# Patient Record
Sex: Female | Born: 1945 | Race: White | Hispanic: No | Marital: Married | Smoking: Former smoker
Health system: Southern US, Community
[De-identification: ages and names within clinical notes are randomized; demographics above are authoritative.]

## PROBLEM LIST (undated history)

## (undated) DIAGNOSIS — E079 Disorder of thyroid, unspecified: Secondary | ICD-10-CM

## (undated) DIAGNOSIS — G4710 Hypersomnia, unspecified: Secondary | ICD-10-CM

## (undated) DIAGNOSIS — E7800 Pure hypercholesterolemia, unspecified: Secondary | ICD-10-CM

## (undated) DIAGNOSIS — R351 Nocturia: Secondary | ICD-10-CM

## (undated) DIAGNOSIS — R0683 Snoring: Secondary | ICD-10-CM

## (undated) DIAGNOSIS — H9190 Unspecified hearing loss, unspecified ear: Secondary | ICD-10-CM

## (undated) HISTORY — PX: COLONOSCOPY: SHX174

## (undated) HISTORY — DX: Hypersomnia, unspecified: G47.10

## (undated) HISTORY — DX: Snoring: R06.83

## (undated) HISTORY — DX: Pure hypercholesterolemia, unspecified: E78.00

## (undated) HISTORY — DX: Nocturia: R35.1

## (undated) HISTORY — DX: Unspecified hearing loss, unspecified ear: H91.90

## (undated) HISTORY — DX: Disorder of thyroid, unspecified: E07.9

---

## 2006-07-12 LAB — HM COLONOSCOPY

## 2011-07-13 LAB — HM PAP SMEAR: HM Pap smear: NORMAL

## 2014-07-12 HISTORY — PX: CATARACT EXTRACTION, BILATERAL: SHX1313

## 2015-11-11 DIAGNOSIS — Z1231 Encounter for screening mammogram for malignant neoplasm of breast: Secondary | ICD-10-CM | POA: Diagnosis not present

## 2015-11-11 LAB — HM MAMMOGRAPHY

## 2015-11-19 DIAGNOSIS — H26493 Other secondary cataract, bilateral: Secondary | ICD-10-CM | POA: Diagnosis not present

## 2016-03-08 DIAGNOSIS — M159 Polyosteoarthritis, unspecified: Secondary | ICD-10-CM | POA: Diagnosis not present

## 2016-03-08 DIAGNOSIS — E039 Hypothyroidism, unspecified: Secondary | ICD-10-CM | POA: Diagnosis not present

## 2016-03-08 DIAGNOSIS — E785 Hyperlipidemia, unspecified: Secondary | ICD-10-CM | POA: Diagnosis not present

## 2016-03-08 DIAGNOSIS — R351 Nocturia: Secondary | ICD-10-CM | POA: Diagnosis not present

## 2016-03-28 DIAGNOSIS — J209 Acute bronchitis, unspecified: Secondary | ICD-10-CM | POA: Diagnosis not present

## 2016-04-13 DIAGNOSIS — G4710 Hypersomnia, unspecified: Secondary | ICD-10-CM | POA: Diagnosis not present

## 2016-04-13 DIAGNOSIS — R0683 Snoring: Secondary | ICD-10-CM | POA: Diagnosis not present

## 2016-04-19 DIAGNOSIS — M25562 Pain in left knee: Secondary | ICD-10-CM | POA: Diagnosis not present

## 2016-05-02 DIAGNOSIS — Z23 Encounter for immunization: Secondary | ICD-10-CM | POA: Diagnosis not present

## 2016-05-06 DIAGNOSIS — G4710 Hypersomnia, unspecified: Secondary | ICD-10-CM | POA: Diagnosis not present

## 2016-05-06 DIAGNOSIS — Z780 Asymptomatic menopausal state: Secondary | ICD-10-CM | POA: Diagnosis not present

## 2016-05-06 LAB — HM DEXA SCAN

## 2016-05-26 DIAGNOSIS — G4733 Obstructive sleep apnea (adult) (pediatric): Secondary | ICD-10-CM | POA: Diagnosis not present

## 2016-06-08 DIAGNOSIS — R0683 Snoring: Secondary | ICD-10-CM | POA: Diagnosis not present

## 2016-06-08 DIAGNOSIS — G4710 Hypersomnia, unspecified: Secondary | ICD-10-CM | POA: Diagnosis not present

## 2016-06-08 DIAGNOSIS — G4733 Obstructive sleep apnea (adult) (pediatric): Secondary | ICD-10-CM | POA: Diagnosis not present

## 2016-07-22 DIAGNOSIS — G4733 Obstructive sleep apnea (adult) (pediatric): Secondary | ICD-10-CM | POA: Diagnosis not present

## 2016-08-21 DIAGNOSIS — J111 Influenza due to unidentified influenza virus with other respiratory manifestations: Secondary | ICD-10-CM | POA: Diagnosis not present

## 2016-09-14 DIAGNOSIS — H9190 Unspecified hearing loss, unspecified ear: Secondary | ICD-10-CM | POA: Diagnosis not present

## 2016-09-14 DIAGNOSIS — E785 Hyperlipidemia, unspecified: Secondary | ICD-10-CM | POA: Diagnosis not present

## 2016-09-14 DIAGNOSIS — E039 Hypothyroidism, unspecified: Secondary | ICD-10-CM | POA: Diagnosis not present

## 2016-09-14 DIAGNOSIS — M159 Polyosteoarthritis, unspecified: Secondary | ICD-10-CM | POA: Diagnosis not present

## 2016-09-14 LAB — HEPATIC FUNCTION PANEL
ALT: 20 (ref 7–35)
AST: 21 (ref 13–35)
Alkaline Phosphatase: 65 (ref 25–125)
Bilirubin, Total: 0.5

## 2016-09-14 LAB — BASIC METABOLIC PANEL
BUN: 11 (ref 4–21)
Creatinine: 0.8 (ref 0.5–1.1)
Glucose: 91
Potassium: 4.3 (ref 3.4–5.3)
Sodium: 138 (ref 137–147)

## 2016-09-14 LAB — TSH: TSH: 1.86 (ref 0.41–5.90)

## 2016-09-14 LAB — LIPID PANEL
Cholesterol: 179 (ref 0–200)
HDL: 53 (ref 35–70)
LDL Cholesterol: 109
Triglycerides: 87 (ref 40–160)

## 2017-01-19 DIAGNOSIS — H9202 Otalgia, left ear: Secondary | ICD-10-CM | POA: Diagnosis not present

## 2017-01-24 DIAGNOSIS — Z7289 Other problems related to lifestyle: Secondary | ICD-10-CM | POA: Diagnosis not present

## 2017-01-24 DIAGNOSIS — H7202 Central perforation of tympanic membrane, left ear: Secondary | ICD-10-CM | POA: Diagnosis not present

## 2017-01-24 DIAGNOSIS — H6122 Impacted cerumen, left ear: Secondary | ICD-10-CM | POA: Diagnosis not present

## 2017-01-24 DIAGNOSIS — H9113 Presbycusis, bilateral: Secondary | ICD-10-CM | POA: Diagnosis not present

## 2017-01-24 DIAGNOSIS — Z974 Presence of external hearing-aid: Secondary | ICD-10-CM | POA: Diagnosis not present

## 2017-02-22 DIAGNOSIS — H7292 Unspecified perforation of tympanic membrane, left ear: Secondary | ICD-10-CM | POA: Diagnosis not present

## 2017-03-30 ENCOUNTER — Ambulatory Visit (INDEPENDENT_AMBULATORY_CARE_PROVIDER_SITE_OTHER): Payer: Medicare Other | Admitting: Nurse Practitioner

## 2017-03-30 ENCOUNTER — Encounter: Payer: Self-pay | Admitting: Nurse Practitioner

## 2017-03-30 VITALS — BP 132/80 | HR 95 | Temp 98.0°F | Resp 12 | Ht 63.0 in | Wt 192.8 lb

## 2017-03-30 DIAGNOSIS — E782 Mixed hyperlipidemia: Secondary | ICD-10-CM | POA: Insufficient documentation

## 2017-03-30 DIAGNOSIS — G4733 Obstructive sleep apnea (adult) (pediatric): Secondary | ICD-10-CM

## 2017-03-30 DIAGNOSIS — Z23 Encounter for immunization: Secondary | ICD-10-CM | POA: Diagnosis not present

## 2017-03-30 DIAGNOSIS — M1712 Unilateral primary osteoarthritis, left knee: Secondary | ICD-10-CM | POA: Insufficient documentation

## 2017-03-30 DIAGNOSIS — E034 Atrophy of thyroid (acquired): Secondary | ICD-10-CM

## 2017-03-30 DIAGNOSIS — Z9989 Dependence on other enabling machines and devices: Secondary | ICD-10-CM

## 2017-03-30 LAB — CBC WITH DIFFERENTIAL/PLATELET
Basophils Absolute: 50 cells/uL (ref 0–200)
Basophils Relative: 0.8 %
Eosinophils Absolute: 149 cells/uL (ref 15–500)
Eosinophils Relative: 2.4 %
HCT: 40.6 % (ref 35.0–45.0)
Hemoglobin: 13.7 g/dL (ref 11.7–15.5)
Lymphs Abs: 905 cells/uL (ref 850–3900)
MCH: 31.4 pg (ref 27.0–33.0)
MCHC: 33.7 g/dL (ref 32.0–36.0)
MCV: 92.9 fL (ref 80.0–100.0)
MPV: 11.3 fL (ref 7.5–12.5)
Monocytes Relative: 8.8 %
Neutro Abs: 4551 cells/uL (ref 1500–7800)
Neutrophils Relative %: 73.4 %
Platelets: 281 10*3/uL (ref 140–400)
RBC: 4.37 10*6/uL (ref 3.80–5.10)
RDW: 12.6 % (ref 11.0–15.0)
Total Lymphocyte: 14.6 %
WBC mixed population: 546 cells/uL (ref 200–950)
WBC: 6.2 10*3/uL (ref 3.8–10.8)

## 2017-03-30 LAB — COMPLETE METABOLIC PANEL WITH GFR
AG Ratio: 1.6 (calc) (ref 1.0–2.5)
ALT: 20 U/L (ref 6–29)
AST: 23 U/L (ref 10–35)
Albumin: 4.1 g/dL (ref 3.6–5.1)
Alkaline phosphatase (APISO): 61 U/L (ref 33–130)
BUN: 19 mg/dL (ref 7–25)
CO2: 28 mmol/L (ref 20–32)
Calcium: 9.2 mg/dL (ref 8.6–10.4)
Chloride: 105 mmol/L (ref 98–110)
Creat: 0.88 mg/dL (ref 0.60–0.93)
GFR, Est African American: 76 mL/min/{1.73_m2} (ref >=60)
GFR, Est Non African American: 66 mL/min/{1.73_m2} (ref >=60)
Globulin: 2.6 g/dL (calc) (ref 1.9–3.7)
Glucose, Bld: 103 mg/dL (ref 65–139)
Potassium: 4.4 mmol/L (ref 3.5–5.3)
Sodium: 141 mmol/L (ref 135–146)
Total Bilirubin: 0.4 mg/dL (ref 0.2–1.2)
Total Protein: 6.7 g/dL (ref 6.1–8.1)

## 2017-03-30 LAB — TSH: TSH: 2.22 mIU/L (ref 0.40–4.50)

## 2017-03-30 MED ORDER — LEVOTHYROXINE SODIUM 75 MCG PO TABS: 75.0000 ug | ORAL_TABLET | Freq: Every day | ORAL | 1 refills | Status: DC

## 2017-03-30 MED ORDER — ATORVASTATIN CALCIUM 20 MG PO TABS: 20.0000 mg | ORAL_TABLET | Freq: Every day | ORAL | 1 refills | Status: DC

## 2017-03-30 NOTE — Progress Notes (Signed)
Careteam: Patient Care Team: Lauree Chandler, NP as PCP - General (Geriatric Medicine) Izora Gala, MD as Consulting Physician (Otolaryngology)  Advanced Directive information    No Known Allergies  Chief Complaint  Patient presents with  . Establish Care    New Patient [facility] care, moved to [place] in July from [place] .   . Medication Refill    Refill medications for [facility] day supply- [facility]  . Referral    Need referral to eye doctor and sleep specialist   . Immunizations    Flu Vaccine today      HPI: Patient is a 71 y.o. female seen in the office today for routine follow up and to establish care. Pt relocated to [place] from the [place] because her family lives here (daughter and grandchildren). Still works on [employer], relief. Worked as a Marine scientist now in Engineer, mining  OSA- Would like referral to sleep specialist- using CPAP and needs someone to keep up with this.   Seasonal allergies- using azelastine spray as needed   Vit D Def- taking vit D supplement.   Hypothyroid- taking synthroid 75 mcg daily  Left knee OA- using naproxen at bedtime and as needed   Had gained weight but now getting back on track. Using my fitness pal for calorie tracking and [facility]  Review of Systems:  Review of Systems  Constitutional: Negative for chills, fever and weight loss.  HENT: Positive for hearing loss. Negative for tinnitus.   Respiratory: Negative for cough, sputum production and shortness of breath.   Cardiovascular: Negative for chest pain, palpitations and leg swelling.  Gastrointestinal: Negative for abdominal pain, constipation, diarrhea and heartburn.  Genitourinary: Positive for frequency (chronic). Negative for dysuria and urgency.  Musculoskeletal: Positive for joint pain (left knee). Negative for back pain, falls and myalgias.  Skin: Negative.   Neurological: Negative for dizziness and headaches.  Psychiatric/Behavioral: Negative for depression  and memory loss. The patient does not have insomnia.     Past Medical History:  Diagnosis Date  . Hearing loss   . High cholesterol   . Hypersomnia    Per Records from [facility]   . Nocturia    Per Records from [facility]   . Snoring    Per Records from [facility]   . Thyroid disease    Past Surgical History:  Procedure Laterality Date  . CATARACT EXTRACTION, BILATERAL  07/12/2014  . CESAREAN SECTION     AGE 57  . COLONOSCOPY     10 YEARS AGO AS OF 2018    Social History:   reports that she has quit smoking. Her smoking use included Cigarettes. She has a 3.00 pack-year smoking history. She has never used smokeless tobacco. She reports that she drinks alcohol. She reports that she does not use drugs.  Family History  Problem Relation Age of Onset  . Osteoporosis Mother   . Heart disease Father     Medications: Patient's Medications  New Prescriptions   No medications on file  Previous Medications   ATORVASTATIN (LIPITOR) 20 MG TABLET    Take 20 mg by mouth daily.   AZELASTINE HCL 0.15 % SOLN    Place 1 spray into the nose as needed.   CALCIUM CARBONATE (TUMS - DOSED IN MG ELEMENTAL CALCIUM) 500 MG CHEWABLE TABLET    Chew 1 tablet by mouth daily.   CHOLECALCIFEROL (VITAMIN D) 1000 UNITS TABLET    Take 1,000  Units by mouth daily. D3   LEVOTHYROXINE (SYNTHROID, LEVOTHROID) 75 MCG TABLET    Take 75 mcg by mouth daily before breakfast.   NAPROXEN SODIUM (ANAPROX) 220 MG TABLET    Take 220 mg by mouth as needed.  Modified Medications   No medications on file  Discontinued Medications   NAPROXEN (EC NAPROSYN) 500 MG EC TABLET    Take 500 mg by mouth daily.   OMEPRAZOLE (PRILOSEC OTC) 20 MG TABLET    Take 20 mg by mouth daily as needed.     Physical Exam:  Vitals:   03/30/17 0903  BP: 132/80  Pulse: 95  Resp: 12  Temp: 98 F (36.7 C)  TempSrc: Oral  SpO2: 97%  Weight: 192 lb 12.8 oz (87.5 kg)  Height: 5' 3"  (1.6 m)   Body mass  index is 34.15 kg/m.  Physical Exam  Constitutional: She is oriented to person, place, and time. She appears well-developed and well-nourished. No distress.  HENT:  Head: Normocephalic and atraumatic.  Mouth/Throat: Oropharynx is clear and moist.  Eyes: Pupils are equal, round, and reactive to light. Conjunctivae are normal.  Neck: Normal range of motion.  Cardiovascular: Normal rate, regular rhythm and normal heart sounds.   Pulmonary/Chest: Effort normal and breath sounds normal.  Abdominal: Soft. Bowel sounds are normal.  Musculoskeletal: She exhibits no edema or tenderness.  Neurological: She is alert and oriented to person, place, and time.  Skin: Skin is warm and dry. She is not diaphoretic.  Psychiatric: She has a normal mood and affect.    Labs reviewed: Basic Metabolic Panel:  Recent Labs  09/14/16  NA 138  K 4.3  BUN 11  CREATININE 0.8  TSH 1.86   Liver Function Tests:  Recent Labs  09/14/16  AST 21  ALT 20  ALKPHOS 65   No results for input(s): LIPASE, AMYLASE in the last 8760 hours. No results for input(s): AMMONIA in the last 8760 hours. CBC: No results for input(s): WBC, NEUTROABS, HGB, HCT, MCV, PLT in the last 8760 hours. Lipid Panel:  Recent Labs  09/14/16  CHOL 179  HDL 53  LDLCALC 109  TRIG 87   TSH:  Recent Labs  09/14/16  TSH 1.86   A1C: No results found for: HGBA1C   Assessment/Plan .1. OSA on CPAP Uses CPAP nightly, needs to establish with pulmonary for supplies  - Ambulatory referral to Pulmonology  2. Mixed hyperlipidemia Has been making dietary changes and watching calories as well as increase in activity.  - atorvastatin (LIPITOR) 20 MG tablet; Take 1 tablet (20 mg total) by mouth daily.  Dispense: 90 tablet; Refill: 1 - CMP with eGFR  3. Hypothyroidism due to acquired atrophy of thyroid - levothyroxine (SYNTHROID, LEVOTHROID) 75 MCG tablet; Take 1 tablet (75 mcg total) by mouth daily before breakfast.  Dispense: 90  tablet; Refill: 1 - TSH  4. Osteoarthritis of left knee, unspecified osteoarthritis type -controlled with the uses aleve qhs and occasionally during the day.  - CBC with Differential/Platelets  5.  High-dose Flu shot given.   Next appt: 6 weeks for AWV and extended visit.  Carlos American. Harle Battiest  [facility] [phone] 8 am - 5 pm) [phone] (after hours)

## 2017-03-30 NOTE — Patient Instructions (Addendum)
[facility]  Address: [address] [place], [place], [place] [address] Phone: [phone]   [facility] Address: [address] [place], [place], [place] [address] Phone: [phone]   Follow up in 6 weeks for AWV and Physical

## 2017-04-01 DIAGNOSIS — H7292 Unspecified perforation of tympanic membrane, left ear: Secondary | ICD-10-CM | POA: Diagnosis not present

## 2017-04-01 DIAGNOSIS — H9113 Presbycusis, bilateral: Secondary | ICD-10-CM | POA: Diagnosis not present

## 2017-04-01 DIAGNOSIS — H60332 Swimmer's ear, left ear: Secondary | ICD-10-CM | POA: Diagnosis not present

## 2017-05-05 ENCOUNTER — Ambulatory Visit (INDEPENDENT_AMBULATORY_CARE_PROVIDER_SITE_OTHER): Payer: Medicare Other

## 2017-05-05 VITALS — BP 136/72 | HR 95 | Temp 98.0°F | Ht 63.0 in | Wt 192.0 lb

## 2017-05-05 DIAGNOSIS — Z0000 Encounter for general adult medical examination without abnormal findings: Secondary | ICD-10-CM | POA: Diagnosis not present

## 2017-05-05 MED ORDER — ZOSTER VAC RECOMB ADJUVANTED 50 MCG/0.5ML IM SUSR: 0.5000 mL | Freq: Once | INTRAMUSCULAR | 1 refills | Status: AC

## 2017-05-05 NOTE — Patient Instructions (Signed)
[name] , Thank you for taking time to come for your Medicare Wellness Visit. I appreciate your ongoing commitment to your health goals. Please review the following plan we discussed and let me know if I can assist you in the future.   Screening recommendations/referrals: Colonoscopy due Mammogram up to date. Due 11/10/2017 Bone Density up to date Recommended yearly ophthalmology/optometry visit for glaucoma screening and checkup Recommended yearly dental visit for hygiene and checkup  Vaccinations: Influenza vaccine up to date. Due 2019 fall season Pneumococcal vaccine  Tdap vaccine up to date. Due 07/13/2019 Shingles vaccine due, if you decide you want this we will sent the prescription to your pharmacy  Advanced directives: Please bring us a copy of your living will and health care power of attorney  Conditions/risks identified: None  Next appointment: Ann ChattersJessica Eubanks NP 05/11/2017 @ 1:30pm   Preventive Care 65 Years and Older, Female Preventive care refers to lifestyle choices and visits with your health care provider that can promote health and wellness. What does preventive care include?  A yearly physical exam. This is also called an annual well check.  Dental exams once or twice a year.  Routine eye exams. Ask your health care provider how often you should have your eyes checked.  Personal lifestyle choices, including:  Daily care of your teeth and gums.  Regular physical activity.  Eating a healthy diet.  Avoiding tobacco and drug use.  Limiting alcohol use.  Practicing safe sex.  Taking low-dose aspirin every day.  Taking vitamin and mineral supplements as recommended by your health care provider. What happens during an annual well check? The services and screenings done by your health care provider during your annual well check will depend on your age, overall health, lifestyle risk factors, and family history of disease. Counseling  Your health care  provider may ask you questions about your:  Alcohol use.  Tobacco use.  Drug use.  Emotional well-being.  Home and relationship well-being.  Sexual activity.  Eating habits.  History of falls.  Memory and ability to understand (cognition).  Work and work Astronomerenvironment.  Reproductive health. Screening  You may have the following tests or measurements:  Height, weight, and BMI.  Blood pressure.  Lipid and cholesterol levels. These may be checked every 5 years, or more frequently if you are over 72 years old.  Skin check.  Lung cancer screening. You may have this screening every year starting at age 72 if you have a 30-pack-year history of smoking and currently smoke or have quit within the past 15 years.  Fecal occult blood test (FOBT) of the stool. You may have this test every year starting at age 72.  Flexible sigmoidoscopy or colonoscopy. You may have a sigmoidoscopy every 5 years or a colonoscopy every 10 years starting at age 72.  Hepatitis C blood test.  Hepatitis B blood test.  Sexually transmitted disease (STD) testing.  Diabetes screening. This is done by checking your blood sugar (glucose) after you have not eaten for a while (fasting). You may have this done every 1-3 years.  Bone density scan. This is done to screen for osteoporosis. You may have this done starting at age 72.  Mammogram. This may be done every 1-2 years. Talk to your health care provider about how often you should have regular mammograms. Talk with your health care provider about your test results, treatment options, and if necessary, the need for more tests. Vaccines  Your health care provider may recommend  certain vaccines, such as:  Influenza vaccine. This is recommended every year.  Tetanus, diphtheria, and acellular pertussis (Tdap, Td) vaccine. You may need a Td booster every 10 years.  Zoster vaccine. You may need this after age 72.  Pneumococcal 13-valent conjugate (PCV13)  vaccine. One dose is recommended after age 72.  Pneumococcal polysaccharide (PPSV23) vaccine. One dose is recommended after age 72. Talk to your health care provider about which screenings and vaccines you need and how often you need them. This information is not intended to replace advice given to you by your health care provider. Make sure you discuss any questions you have with your health care provider. Document Released: 07/25/2015 Document Revised: 03/17/2016 Document Reviewed: 04/29/2015 Elsevier Interactive Patient Education  2017 [place] Prevention in the Home Falls can cause injuries. They can happen to people of all ages. There are many things you can do to make your home safe and to help prevent falls. What can I do on the outside of my home?  Regularly fix the edges of walkways and driveways and fix any cracks.  Remove anything that might make you trip as you walk through a door, such as a raised step or threshold.  Trim any bushes or trees on the path to your home.  Use bright outdoor lighting.  Clear any walking paths of anything that might make someone trip, such as rocks or tools.  Regularly check to see if handrails are loose or broken. Make sure that both sides of any steps have handrails.  Any raised decks and porches should have guardrails on the edges.  Have any leaves, snow, or ice cleared regularly.  Use sand or salt on walking paths during winter.  Clean up any spills in your garage right away. This includes oil or grease spills. What can I do in the bathroom?  Use night lights.  Install grab bars by the toilet and in the tub and shower. Do not use towel bars as grab bars.  Use non-skid mats or decals in the tub or shower.  If you need to sit down in the shower, use a plastic, non-slip stool.  Keep the floor dry. Clean up any water that spills on the floor as soon as it happens.  Remove soap buildup in the tub or shower  regularly.  Attach bath mats securely with double-sided non-slip rug tape.  Do not have throw rugs and other things on the floor that can make you trip. What can I do in the bedroom?  Use night lights.  Make sure that you have a light by your bed that is easy to reach.  Do not use any sheets or blankets that are too big for your bed. They should not hang down onto the floor.  Have a firm chair that has side arms. You can use this for support while you get dressed.  Do not have throw rugs and other things on the floor that can make you trip. What can I do in the kitchen?  Clean up any spills right away.  Avoid walking on wet floors.  Keep items that you use a lot in easy-to-reach places.  If you need to reach something above you, use a strong step stool that has a grab bar.  Keep electrical cords out of the way.  Do not use floor polish or wax that makes floors slippery. If you must use wax, use non-skid floor wax.  Do not have throw rugs and other  things on the floor that can make you trip. What can I do with my stairs?  Do not leave any items on the stairs.  Make sure that there are handrails on both sides of the stairs and use them. Fix handrails that are broken or loose. Make sure that handrails are as long as the stairways.  Check any carpeting to make sure that it is firmly attached to the stairs. Fix any carpet that is loose or worn.  Avoid having throw rugs at the top or bottom of the stairs. If you do have throw rugs, attach them to the floor with carpet tape.  Make sure that you have a light switch at the top of the stairs and the bottom of the stairs. If you do not have them, ask someone to add them for you. What else can I do to help prevent falls?  Wear shoes that:  Do not have high heels.  Have rubber bottoms.  Are comfortable and fit you well.  Are closed at the toe. Do not wear sandals.  If you use a stepladder:  Make sure that it is fully  opened. Do not climb a closed stepladder.  Make sure that both sides of the stepladder are locked into place.  Ask someone to hold it for you, if possible.  Clearly mark and make sure that you can see:  Any grab bars or handrails.  First and last steps.  Where the edge of each step is.  Use tools that help you move around (mobility aids) if they are needed. These include:  Canes.  Walkers.  Scooters.  Crutches.  Turn on the lights when you go into a dark area. Replace any light bulbs as soon as they burn out.  Set up your furniture so you have a clear path. Avoid moving your furniture around.  If any of your floors are uneven, fix them.  If there are any pets around you, be aware of where they are.  Review your medicines with your doctor. Some medicines can make you feel dizzy. This can increase your chance of falling. Ask your doctor what other things that you can do to help prevent falls. This information is not intended to replace advice given to you by your health care provider. Make sure you discuss any questions you have with your health care provider. Document Released: 04/24/2009 Document Revised: 12/04/2015 Document Reviewed: 08/02/2014 Elsevier Interactive Patient Education  2017 [employer].

## 2017-05-05 NOTE — Progress Notes (Signed)
Subjective:   Ann Hamilton is a 71 y.o. female who presents for an Initial Medicare Annual Wellness Visit.     Objective:    Today's Vitals   05/05/17 1002  BP: 136/72  Pulse: 95  Temp: 98 F (36.7 C)  TempSrc: Oral  SpO2: 97%  Weight: 192 lb (87.1 kg)  Height: 5\' 3"  (1.6 m)   Body mass index is 34.01 kg/m.   Current Medications (verified) Outpatient Encounter Prescriptions as of 05/05/2017  Medication Sig  . atorvastatin (LIPITOR) 20 MG tablet Take 1 tablet (20 mg total) by mouth daily.  . Azelastine HCl 0.15 % SOLN Place 1 spray into the nose as needed.  . calcium carbonate (TUMS - DOSED IN MG ELEMENTAL CALCIUM) 500 MG chewable tablet Chew 1 tablet by mouth daily.  . cholecalciferol (VITAMIN D) 1000 units tablet Take 1,000 Units by mouth daily. D3  . levothyroxine (SYNTHROID, LEVOTHROID) 75 MCG tablet Take 1 tablet (75 mcg total) by mouth daily before breakfast.  . naproxen sodium (ANAPROX) 220 MG tablet Take 220 mg by mouth as needed.  . Zoster Vaccine Adjuvanted [facility]) injection Inject 0.5 mLs into the muscle once.   No facility-administered encounter medications on file as of 05/05/2017.     Allergies (verified) Patient has no known allergies.   History: Past Medical History:  Diagnosis Date  . Hearing loss   . High cholesterol   . Hypersomnia    Per [facility] from [facility]   . Nocturia    Per Records from [facility]   . Snoring    Per [facility]   . Thyroid disease    Past Surgical History:  Procedure Laterality Date  . CATARACT EXTRACTION, BILATERAL  07/12/2014  . CESAREAN SECTION     AGE 68  . COLONOSCOPY     10 YEARS AGO AS OF 2018    Family History  Problem Relation Age of Onset  . Osteoporosis Mother   . Heart disease Father   . Cancer Father   . Heart failure Father    Social History   Occupational History  . Not on file.   Social History Main Topics  . Smoking status: Former  Smoker    Packs/day: 1.00    Years: 3.00    Types: Cigarettes  . Smokeless tobacco: Never Used     Comment: Quit 50 years ago  . Alcohol use Yes     Comment: 2 drinks weekly   . Drug use: No  . Sexual activity: Not on file    Tobacco Counseling Counseling given: Not Answered   Activities of Daily Living In your present state of health, do you have any difficulty performing the following activities: 05/05/2017  Hearing? N  Vision? N  Difficulty concentrating or making decisions? N  Walking or climbing stairs? N  Dressing or bathing? N  Doing errands, shopping? N  Preparing Food and eating ? N  Using the Toilet? N  In the past six months, have you accidently leaked urine? N  Do you have problems with loss of bowel control? N  Managing your Medications? N  Managing your Finances? N  Housekeeping or managing your Housekeeping? N    Immunizations and Health Maintenance Immunization History  Administered Date(s) Administered  . Influenza, High Dose Seasonal PF 03/30/2017  . Influenza-Unspecified 04/11/2016  . Td 07/12/2009   Health Maintenance Due  Topic Date Due  . COLONOSCOPY  07/12/2016    Patient Care  Team: Sharon SellerEubanks, Jessica K, NP as PCP - General (Geriatric Medicine) Serena Colonelosen, Jefry, MD as Consulting Physician (Otolaryngology)  Indicate any recent Medical Services you may have received from other than [facility] providers in the past year (date may be approximate).     Assessment:   This is a routine wellness examination for Ann Hamilton.   Hearing/Vision screen No exam data present  Dietary issues and exercise activities discussed: Current Exercise Habits: Home exercise routine;Structured exercise class, Type of exercise: Other - see comments ([employer] classes), Time (Minutes): 40, Frequency (Times/Week): 3, Weekly Exercise (Minutes/Week): 120, Intensity: Mild  Goals    . Exercise 3x per week (30 min per time)          Patient will continue exercising 3 days a week.       Depression Screen PHQ 2/9 Scores 03/30/2017  PHQ - 2 Score 0    Fall Risk Fall Risk  05/05/2017 03/30/2017  Falls in the past year? No No    Cognitive Function: MMSE - Mini Mental State Exam 05/05/2017  Orientation to time 5  Orientation to Place 5  Registration 3  Attention/ Calculation 5  Recall 3  Language- name 2 objects 2  Language- repeat 1  Language- follow 3 step command 3  Language- read & follow direction 1  Write a sentence 1  Copy design 1  Total score 30        Screening Tests Health Maintenance  Topic Date Due  . COLONOSCOPY  07/12/2016  . MAMMOGRAM  11/10/2017  . TETANUS/TDAP  07/13/2019  . INFLUENZA VACCINE  Completed  . DEXA SCAN  Completed  . Hepatitis C Screening  Completed  . PNA vac Low Risk Adult  Completed      Plan:  I have personally reviewed and addressed the Medicare Annual Wellness questionnaire and have noted the following in the patient's chart:  A. Medical and social history B. Use of alcohol, tobacco or illicit drugs  C. Current medications and supplements D. Functional ability and status E.  Nutritional status F.  Physical activity G. Advance directives H. List of other physicians I.  Hospitalizations, surgeries, and ER visits in previous 12 months J.  Vitals K. Screenings to include hearing, vision, cognitive, depression L. Referrals and appointments - none  In addition, I have reviewed and discussed with patient certain preventive protocols, quality metrics, and best practice recommendations. A written personalized care plan for preventive services as well as general preventive health recommendations were provided to patient.  See attached scanned questionnaire for additional information.   Signed,   Annetta MawSara Gonthier, RN Nurse Health Advisor   Quick Notes   Health Maintenance: Patient will think about getting a colonoscopy. Shingrix sent to pharmacy.     Abnormal Screen: MMSE 30/30. Passed clock  drawing     Patient Concerns: None     Nurse Concerns: None

## 2017-05-11 ENCOUNTER — Ambulatory Visit (INDEPENDENT_AMBULATORY_CARE_PROVIDER_SITE_OTHER): Payer: Medicare Other | Admitting: Pulmonary Disease

## 2017-05-11 ENCOUNTER — Ambulatory Visit (INDEPENDENT_AMBULATORY_CARE_PROVIDER_SITE_OTHER): Payer: Medicare Other | Admitting: Nurse Practitioner

## 2017-05-11 ENCOUNTER — Encounter: Payer: Self-pay | Admitting: Pulmonary Disease

## 2017-05-11 ENCOUNTER — Encounter: Payer: Self-pay | Admitting: Nurse Practitioner

## 2017-05-11 VITALS — BP 124/82 | HR 104 | Ht 63.0 in | Wt 192.0 lb

## 2017-05-11 VITALS — BP 134/84 | HR 88 | Temp 97.5°F | Resp 18 | Ht 63.0 in | Wt 194.6 lb

## 2017-05-11 DIAGNOSIS — Z0000 Encounter for general adult medical examination without abnormal findings: Secondary | ICD-10-CM

## 2017-05-11 DIAGNOSIS — E034 Atrophy of thyroid (acquired): Secondary | ICD-10-CM | POA: Diagnosis not present

## 2017-05-11 DIAGNOSIS — Z9989 Dependence on other enabling machines and devices: Secondary | ICD-10-CM

## 2017-05-11 DIAGNOSIS — G4733 Obstructive sleep apnea (adult) (pediatric): Secondary | ICD-10-CM

## 2017-05-11 DIAGNOSIS — M1712 Unilateral primary osteoarthritis, left knee: Secondary | ICD-10-CM

## 2017-05-11 DIAGNOSIS — M8580 Other specified disorders of bone density and structure, unspecified site: Secondary | ICD-10-CM

## 2017-05-11 DIAGNOSIS — E782 Mixed hyperlipidemia: Secondary | ICD-10-CM | POA: Diagnosis not present

## 2017-05-11 NOTE — Progress Notes (Signed)
Subjective:    Patient ID: Myrtie CruiseJane Roberg, female    DOB: 02/15/1946, 71 y.o.   MRN: [id number]  HPI  Chief Complaint  Patient presents with  . Sleep Consult    Patient is already on a CPAP machine. Uses [employer] as her DME. Patient brought copies of her SS with her.    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  OSA was diagnosed in 2017 by PSG that showed AHI of 15/hour with lowest desaturation around 81%.  Based on this he was started on CPAP of 11 cm with full facemask with excellent improvement in his daytime somnolence and fatigue.  She has a [employer] machine with a full facemask and her DME is [facility] but only complaint is that FF mask leaves indentation on her bridge of nose  Sleepiness score now has decreased to 1 and she denies Bedtime is between 9 and 10 PM, sleep latency is about 20 minutes, she sleeps on her side with 2 pillows, reports 2 nocturnal awakenings including nocturia that is much improved than prior and is out of bed at 6:30 AM feeling rested without dryness of mouth or headache.  She has gained about 15 pounds in the last 2 years There is no history suggestive of cataplexy, sleep paralysis or parasomnias   CPAP download was reviewed from her machine today which showed excellent control of events on 11 cm with excellent compliance and minimal leak  Past Medical History:  Diagnosis Date  . Hearing loss   . High cholesterol   . Hypersomnia    [facility]   . Nocturia    [facility]   . Snoring    [facility]   . Thyroid disease      Past Surgical History:  Procedure Laterality Date  . CATARACT EXTRACTION, BILATERAL  07/12/2014  . CESAREAN SECTION     AGE 53  . COLONOSCOPY     10 YEARS AGO AS OF 2018     No Known Allergies    Social History    Social History  . Marital status: Married    Spouse name: N/A  . Number of children: N/A  . Years of education: N/A   Occupational History  . Not on file.   Social History Main Topics  . Smoking status: Former Smoker    Packs/day: 1.00    Years: 3.00    Types: Cigarettes  . Smokeless tobacco: Never Used     Comment: Quit 50 years ago  . Alcohol use Yes     Comment: 2 drinks weekly   . Drug use: No  . Sexual activity: Not on file   Other Topics Concern  . Not on file   Social History Narrative   Diet: No      Caffeine: Yes      Married, if yes what year: Yes, 1968      Do you live in a house, apartment, assisted living, condo, trailer, ect: Condo, one stories, 2 persons       Pets: No       Current/Past profession: Charity fundraiserN      Exercise: Yes, 3 times weekly, [employer]         Living Will: Yes   DNR: No   POA/HPOA: No      Questions below  were not included in New Patient Packet      Functional Status:   Do you have difficulty bathing or dressing yourself?   Do you have difficulty preparing food or eating?   Do you have difficulty managing your medications?   Do you have difficulty managing your finances?   Do you have difficulty affording your medications?         Family History  Problem Relation Age of Onset  . Osteoporosis Mother   . Heart disease Father   . Cancer Father   . Heart failure Father      Review of Systems Positive for acid heartburn   Constitutional: negative for anorexia, fevers and sweats  Eyes: negative for irritation, redness and visual disturbance  Ears, nose, mouth, throat, and face: negative for earaches, epistaxis, nasal congestion and sore throat  Respiratory: negative for cough, dyspnea on exertion, sputum and wheezing  Cardiovascular: negative for chest pain, dyspnea, lower extremity edema, orthopnea, palpitations and syncope  Gastrointestinal: negative for abdominal pain, constipation, diarrhea, melena, nausea and  vomiting  Genitourinary:negative for dysuria, frequency and hematuria  Hematologic/lymphatic: negative for bleeding, easy bruising and lymphadenopathy  Musculoskeletal:negative for arthralgias, muscle weakness and stiff joints  Neurological: negative for coordination problems, gait problems, headaches and weakness  Endocrine: negative for diabetic symptoms including polydipsia, polyuria and weight loss     Objective:   Physical Exam  Gen. Pleasant, obese, in no distress, normal affect ENT - no lesions, no post nasal drip, class 2-3 airway Neck: No JVD, no thyromegaly, no carotid bruits Lungs: no use of accessory muscles, no dullness to percussion, decreased without rales or rhonchi  Cardiovascular: Rhythm regular, heart sounds  normal, no murmurs or gallops, no peripheral edema Abdomen: soft and non-tender, no hepatosplenomegaly, BS normal. Musculoskeletal: No deformities, no cyanosis or clubbing Neuro:  alert, non focal, no tremors       Assessment & Plan:

## 2017-05-11 NOTE — Patient Instructions (Signed)
CPAP is set at 11 cm & is working well Trial of nasal pillows

## 2017-05-11 NOTE — Assessment & Plan Note (Signed)
CPAP is set at 11 cm & is working well Trial of nasal pillows since FF mask leaves indentation on her bridge of nose CPAP supplies will be renewed for a year  Weight loss encouraged, compliance with goal of at least 4-6 hrs every night is the expectation. Advised against medications with sedative side effects Cautioned against driving when sleepy - understanding that sleepiness will vary on a day to day basis

## 2017-05-11 NOTE — Progress Notes (Signed)
Provider: Sharon Seller, NP  Patient Care Team: Sharon Seller, NP as PCP - General (Geriatric Medicine) Serena Colonel, MD as Consulting Physician (Otolaryngology)  Extended Emergency Contact Information Primary Emergency Contact: Ciavarella,William Address: [address] [place], [place] [address] [place] Home Phone: [phone] Mobile Phone: [phone] Relation: Spouse No Known Allergies Code Status: FULL Goals of Care: Advanced Directive information Advanced Directives 05/11/2017  Does Patient Have a Medical Advance Directive? No  Type of Advance Directive -  Does patient want to make changes to medical advance directive? -  Copy of Healthcare Power of Attorney in Chart? -     Chief Complaint  Patient presents with  . Medical Management of Chronic Issues    extended visit     HPI: Patient is a 71 y.o. female seen in today for an annual wellness exam.   Major illnesses or hospitalization in the last year- none Has spoken with daughter and husband about living will and healthcare wishes   Depression screen [facility] 2/9 03/30/2017  Decreased Interest 0  Down, Depressed, Hopeless 0  PHQ - 2 Score 0    Fall Risk  05/11/2017 05/05/2017 03/30/2017  Falls in the past year? No No No   MMSE - Mini Mental State Exam 05/05/2017  Orientation to time 5  Orientation to Place 5  Registration 3  Attention/ Calculation 5  Recall 3  Language- name 2 objects 2  Language- repeat 1  Language- follow 3 step command 3  Language- read & follow direction 1  Write a sentence 1  Copy design 1  Total score 30     Health Maintenance  Topic Date Due  . COLONOSCOPY  07/12/2016  . MAMMOGRAM  11/10/2017  . TETANUS/TDAP  07/13/2019  . INFLUENZA VACCINE  Completed  . DEXA SCAN  Completed  . Hepatitis C Screening  Completed  . PNA vac Low Risk Adult  Completed    Urinary incontinence? none Diet?aware she needs to make diet changes, eats out a  lot.  Exercise? Going to [employer]- active aging program 3 times a week- 55 mins. Thi chi - 30 mins.  Dentition: need a dentist, generally she goes every 6 months Pain: none  Past Medical History:  Diagnosis Date  . Hearing loss   . High cholesterol   . Hypersomnia    Per [facility]   . Nocturia    Per [facility]   . Snoring    Per [facility]   . Thyroid disease     Past Surgical History:  Procedure Laterality Date  . CATARACT EXTRACTION, BILATERAL  07/12/2014  . CESAREAN SECTION     AGE 77  . COLONOSCOPY     10 YEARS AGO AS OF 2018     Social History   Social History  . Marital status: Married    Spouse name: N/A  . Number of children: N/A  . Years of education: N/A   Social History Main Topics  . Smoking status: Former Smoker    Packs/day: 1.00    Years: 3.00    Types: Cigarettes  . Smokeless tobacco: Never Used     Comment: Quit 50 years ago  . Alcohol use Yes     Comment: 2 drinks weekly   . Drug use: No  . Sexual activity: Not Asked   Other Topics Concern  . None  Social History Narrative   Diet: No      Caffeine: Yes      Married, if yes what year: Yes, 1968      Do you live in a house, apartment, assisted living, condo, trailer, ect: Condo, one stories, 2 persons       Pets: No       Current/Past profession: Charity fundraiserN      Exercise: Yes, 3 times weekly, [employer]         Living Will: Yes   DNR: No   POA/HPOA: No      Questions below were not included in New Patient Packet      Functional Status:   Do you have difficulty bathing or dressing yourself?   Do you have difficulty preparing food or eating?   Do you have difficulty managing your medications?   Do you have difficulty managing your finances?   Do you have difficulty affording your medications?       Family History  Problem Relation Age of Onset  . Osteoporosis Mother   . Heart disease Father   . Cancer Father   .  Heart failure Father     Review of Systems:  Review of Systems  Constitutional: Negative for activity change, appetite change, fatigue and unexpected weight change.  HENT: Positive for hearing loss. Negative for congestion.        Occasionally will get sinus issues, worse in the winter  Eyes: Negative.   Respiratory: Negative for cough and shortness of breath.   Cardiovascular: Negative for chest pain, palpitations and leg swelling.  Gastrointestinal: Negative for abdominal pain, constipation and diarrhea.  Genitourinary: Negative for difficulty urinating and dysuria.  Musculoskeletal: Negative for arthralgias and myalgias.       Occasional will get left lower back pain requiring muscle relaxer due to back spasms, no problems recently  Skin: Negative for color change and wound.       Tiny spot on left lower leg that dermatologist diagnosised as psoriasis, uses steroid cream when it gets bad   Neurological: Negative for dizziness and weakness.  Psychiatric/Behavioral: Negative for agitation, behavioral problems and confusion.     Allergies as of 05/11/2017   No Known Allergies     Medication List       Accurate as of 05/11/17 11:59 PM. Always use your most recent med list.          atorvastatin 20 MG tablet Commonly known as:  LIPITOR Take 1 tablet (20 mg total) by mouth daily.   Azelastine HCl 0.15 % Soln Place 1 spray into the nose as needed.   calcium carbonate 500 MG chewable tablet Commonly known as:  TUMS - dosed in mg elemental calcium Chew 1 tablet by mouth daily.   cholecalciferol 1000 units tablet Commonly known as:  VITAMIN D Take 1,000 Units by mouth daily. D3   levothyroxine 75 MCG tablet Commonly known as:  SYNTHROID, LEVOTHROID Take 1 tablet (75 mcg total) by mouth daily before breakfast.   naproxen sodium 220 MG tablet Commonly known as:  ALEVE Take 220 mg by mouth as needed.         Physical Exam: Vitals:   05/11/17 1349  BP: 134/84    Pulse: 88  Resp: 18  Temp: (!) 97.5 F (36.4 C)  TempSrc: Oral  SpO2: 97%  Weight: 194 lb 9.6 oz (88.3 kg)  Height: 5\' 3"  (1.6 m)   Body mass index is 34.47 kg/m. Physical Exam  Constitutional: She  is oriented to person, place, and time. She appears well-developed and well-nourished. No distress.  HENT:  Head: Normocephalic and atraumatic.  Mouth/Throat: Oropharynx is clear and moist. No oropharyngeal exudate.  Eyes: Pupils are equal, round, and reactive to light. Conjunctivae are normal.  Neck: Normal range of motion. Neck supple.  Cardiovascular: Normal rate, regular rhythm and normal heart sounds.   Pulmonary/Chest: Effort normal and breath sounds normal.  Abdominal: Soft. Bowel sounds are normal.  Musculoskeletal: She exhibits no edema or tenderness.  Neurological: She is alert and oriented to person, place, and time.  Skin: Skin is warm and dry. She is not diaphoretic.  Psychiatric: She has a normal mood and affect.    Labs reviewed: Basic Metabolic Panel:  Recent Labs  16/10/96 03/30/17 1019  NA 138 141  K 4.3 4.4  CL  --  105  CO2  --  28  GLUCOSE  --  103  BUN 11 19  CREATININE 0.8 0.88  CALCIUM  --  9.2  TSH 1.86 2.22   Liver Function Tests:  Recent Labs  09/14/16 03/30/17 1019  AST 21 23  ALT 20 20  ALKPHOS 65  --   BILITOT  --  0.4  PROT  --  6.7   No results for input(s): LIPASE, AMYLASE in the last 8760 hours. No results for input(s): AMMONIA in the last 8760 hours. CBC:  Recent Labs  03/30/17 1019  WBC 6.2  NEUTROABS 4,551  HGB 13.7  HCT 40.6  MCV 92.9  PLT 281   Lipid Panel:  Recent Labs  09/14/16  CHOL 179  HDL 53  LDLCALC 109  TRIG 87   No results found for: HGBA1C  Procedures: No results found.  Assessment/Plan 1. OSA on CPAP -has meet with pulmonary, conts on CPAP  2. Mixed hyperlipidemia -conts on atorvastatin. Diet modifications and exercise. LDL at goal.   3. Hypothyroidism due to acquired atrophy of  thyroid TSH in normal range, conts on synthroid 75 mcg daily  4. Osteoarthritis of left knee, unspecified osteoarthritis type Occasional joint pain, uses an aleve rarely which is very beneficial.   5. Osteopenia Dexa scan 10/26/217 showing OP. Cont on calcium with Vit D with weight bearing activity   6. Wellness examination -doing well.The patient was counseled regarding the appropriate use of alcohol, regular self-examination of the breasts on a monthly basis, prevention of dental and periodontal disease, diet, regular sustained exercise for at least 30 minutes 5 times per week, routine screening interval for mammogram as recommended by the American Cancer Society and ACOG, tobacco use,  and recommended schedule for GI hemoccult testing, colonoscopy, cholesterol, thyroid and diabetes screening. -plans to start a weight watchers program for accountability  -agreeable to cologuard   Next appt: 6 months with Dr Raynelle Dick K. Biagio Borg  [facility] [phone] 8 am - 5 pm) [phone] (after hours)

## 2017-05-11 NOTE — Patient Instructions (Signed)
-weight watchers is a good program to help with accountability    DASH Eating Plan DASH stands for "Dietary Approaches to Stop Hypertension." The DASH eating plan is a healthy eating plan that has been shown to reduce high blood pressure (hypertension). It may also reduce your risk for type 2 diabetes, heart disease, and stroke. The DASH eating plan may also help with weight loss. What are tips for following this plan? General guidelines  Avoid eating more than 2,300 mg (milligrams) of salt (sodium) a day. If you have hypertension, you may need to reduce your sodium intake to 1,500 mg a day.  Limit alcohol intake to no more than 1 drink a day for nonpregnant women and 2 drinks a day for men. One drink equals 12 oz of beer, 5 oz of wine, or 1 oz of hard liquor.  Work with your health care provider to maintain a healthy body weight or to lose weight. Ask what an ideal weight is for you.  Get at least 30 minutes of exercise that causes your heart to beat faster (aerobic exercise) most days of the week. Activities may include walking, swimming, or biking.  Work with your health care provider or diet and nutrition specialist (dietitian) to adjust your eating plan to your individual calorie needs. Reading food labels  Check food labels for the amount of sodium per serving. Choose foods with less than 5 percent of the Daily Value of sodium. Generally, foods with less than 300 mg of sodium per serving fit into this eating plan.  To find whole grains, look for the word "whole" as the first word in the ingredient list. Shopping  Buy products labeled as "low-sodium" or "no salt added."  Buy fresh foods. Avoid canned foods and premade or frozen meals. Cooking  Avoid adding salt when cooking. Use salt-free seasonings or herbs instead of table salt or sea salt. Check with your health care provider or pharmacist before using salt substitutes.  Do not fry foods. Cook foods using healthy methods such  as baking, boiling, grilling, and broiling instead.  Cook with heart-healthy oils, such as olive, canola, soybean, or sunflower oil. Meal planning   Eat a balanced diet that includes: ? 5 or more servings of fruits and vegetables each day. At each meal, try to fill half of your plate with fruits and vegetables. ? Up to 6-8 servings of whole grains each day. ? Less than 6 oz of lean meat, poultry, or fish each day. A 3-oz serving of meat is about the same size as a deck of cards. One egg equals 1 oz. ? 2 servings of low-fat dairy each day. ? A serving of nuts, seeds, or beans 5 times each week. ? Heart-healthy fats. Healthy fats called Omega-3 fatty acids are found in foods such as flaxseeds and coldwater fish, like sardines, salmon, and mackerel.  Limit how much you eat of the following: ? Canned or prepackaged foods. ? Food that is high in trans fat, such as fried foods. ? Food that is high in saturated fat, such as fatty meat. ? Sweets, desserts, sugary drinks, and other foods with added sugar. ? Full-fat dairy products.  Do not salt foods before eating.  Try to eat at least 2 vegetarian meals each week.  Eat more home-cooked food and less restaurant, buffet, and fast food.  When eating at a restaurant, ask that your food be prepared with less salt or no salt, if possible. What foods are recommended? The items  listed may not be a complete list. Talk with your dietitian about what dietary choices are best for you. Grains Whole-grain or whole-wheat bread. Whole-grain or whole-wheat pasta. Brown rice. Modena Morrow. Bulgur. Whole-grain and low-sodium cereals. Pita bread. Low-fat, low-sodium crackers. Whole-wheat flour tortillas. Vegetables Fresh or frozen vegetables (raw, steamed, roasted, or grilled). Low-sodium or reduced-sodium tomato and vegetable juice. Low-sodium or reduced-sodium tomato sauce and tomato paste. Low-sodium or reduced-sodium canned vegetables. Fruits All  fresh, dried, or frozen fruit. Canned fruit in natural juice (without added sugar). Meat and other protein foods Skinless chicken or [place]. Ground chicken or [place]. Pork with fat trimmed off. Fish and seafood. Egg whites. Dried beans, peas, or lentils. Unsalted nuts, nut butters, and seeds. Unsalted canned beans. Lean cuts of beef with fat trimmed off. Low-sodium, lean deli meat. Dairy Low-fat (1%) or fat-free (skim) milk. Fat-free, low-fat, or reduced-fat cheeses. Nonfat, low-sodium ricotta or cottage cheese. Low-fat or nonfat yogurt. Low-fat, low-sodium cheese. Fats and oils Soft margarine without trans fats. Vegetable oil. Low-fat, reduced-fat, or light mayonnaise and salad dressings (reduced-sodium). Canola, safflower, olive, soybean, and sunflower oils. Avocado. Seasoning and other foods Herbs. Spices. Seasoning mixes without salt. Unsalted popcorn and pretzels. Fat-free sweets. What foods are not recommended? The items listed may not be a complete list. Talk with your dietitian about what dietary choices are best for you. Grains Baked goods made with fat, such as croissants, muffins, or some breads. Dry pasta or rice meal packs. Vegetables Creamed or fried vegetables. Vegetables in a cheese sauce. Regular canned vegetables (not low-sodium or reduced-sodium). Regular canned tomato sauce and paste (not low-sodium or reduced-sodium). Regular tomato and vegetable juice (not low-sodium or reduced-sodium). Angie Fava. Olives. Fruits Canned fruit in a light or heavy syrup. Fried fruit. Fruit in cream or butter sauce. Meat and other protein foods Fatty cuts of meat. Ribs. Fried meat. Berniece Salines. Sausage. Bologna and other processed lunch meats. Salami. Fatback. Hotdogs. Bratwurst. Salted nuts and seeds. Canned beans with added salt. Canned or smoked fish. Whole eggs or egg yolks. Chicken or [place] with skin. Dairy Whole or 2% milk, cream, and half-and-half. Whole or full-fat cream cheese. Whole-fat or  sweetened yogurt. Full-fat cheese. Nondairy creamers. Whipped toppings. Processed cheese and cheese spreads. Fats and oils Butter. Stick margarine. Lard. Shortening. Ghee. Bacon fat. Tropical oils, such as coconut, palm kernel, or palm oil. Seasoning and other foods Salted popcorn and pretzels. Onion salt, garlic salt, seasoned salt, table salt, and sea salt. Worcestershire sauce. Tartar sauce. Barbecue sauce. Teriyaki sauce. Soy sauce, including reduced-sodium. Steak sauce. Canned and packaged gravies. Fish sauce. Oyster sauce. Cocktail sauce. Horseradish that you find on the shelf. Ketchup. Mustard. Meat flavorings and tenderizers. Bouillon cubes. Hot sauce and Tabasco sauce. Premade or packaged marinades. Premade or packaged taco seasonings. Relishes. Regular salad dressings. Where to find more information:  National Heart, Lung, and [place]: https://wilson-eaton.com/  American Heart Association: www.heart.org Summary  The DASH eating plan is a healthy eating plan that has been shown to reduce high blood pressure (hypertension). It may also reduce your risk for type 2 diabetes, heart disease, and stroke.  With the DASH eating plan, you should limit salt (sodium) intake to 2,300 mg a day. If you have hypertension, you may need to reduce your sodium intake to 1,500 mg a day.  When on the DASH eating plan, aim to eat more fresh fruits and vegetables, whole grains, lean proteins, low-fat dairy, and heart-healthy fats.  Work with your health care provider or  diet and nutrition specialist (dietitian) to adjust your eating plan to your individual calorie needs. This information is not intended to replace advice given to you by your health care provider. Make sure you discuss any questions you have with your health care provider. Document Released: 06/17/2011 Document Revised: 06/21/2016 Document Reviewed: 06/21/2016 Elsevier Interactive Patient Education  2017 [employer].

## 2017-05-12 DIAGNOSIS — M8580 Other specified disorders of bone density and structure, unspecified site: Secondary | ICD-10-CM | POA: Insufficient documentation

## 2017-05-16 DIAGNOSIS — Z1211 Encounter for screening for malignant neoplasm of colon: Secondary | ICD-10-CM | POA: Diagnosis not present

## 2017-05-16 DIAGNOSIS — Z1212 Encounter for screening for malignant neoplasm of rectum: Secondary | ICD-10-CM | POA: Diagnosis not present

## 2017-05-16 LAB — COLOGUARD: Cologuard: NEGATIVE

## 2017-06-13 ENCOUNTER — Encounter: Payer: Self-pay | Admitting: Nurse Practitioner

## 2017-06-13 DIAGNOSIS — Z961 Presence of intraocular lens: Secondary | ICD-10-CM | POA: Diagnosis not present

## 2017-06-14 ENCOUNTER — Encounter: Payer: Self-pay | Admitting: *Deleted

## 2017-06-14 NOTE — Telephone Encounter (Signed)
I have spoken with patient to give her the results of her cologuard.   Result was negative.

## 2017-08-03 ENCOUNTER — Ambulatory Visit (INDEPENDENT_AMBULATORY_CARE_PROVIDER_SITE_OTHER): Payer: Medicare Other | Admitting: Nurse Practitioner

## 2017-08-03 ENCOUNTER — Encounter: Payer: Self-pay | Admitting: Nurse Practitioner

## 2017-08-03 VITALS — BP 122/84 | HR 92 | Temp 98.7°F | Ht 63.0 in | Wt 195.2 lb

## 2017-08-03 DIAGNOSIS — J0100 Acute maxillary sinusitis, unspecified: Secondary | ICD-10-CM | POA: Diagnosis not present

## 2017-08-03 MED ORDER — FLUTICASONE PROPIONATE 50 MCG/ACT NA SUSP: 2.0000 | Freq: Two times a day (BID) | NASAL | 6 refills | Status: DC

## 2017-08-03 MED ORDER — AMOXICILLIN-POT CLAVULANATE 875-125 MG PO TABS: 1.0000 | ORAL_TABLET | Freq: Two times a day (BID) | ORAL | 0 refills | Status: DC

## 2017-08-03 NOTE — Patient Instructions (Addendum)
neti pot twice daily Plain nasal saline spray throughout the day as needed May use tylenol 325 mg 2 tablets every 6 hours as needed aches and pains or sore throat humidifier in the home to help with the dry air Mucinex DM by mouth twice daily for 7 days then as needed for cough and congestion with full glass of water  Keep well hydrated Avoid forcefully blowing nose To use generic Claritin or Zyrtec 1 tablet daily  To use flonase 1 spray into both nares twice daily     Sinusitis, Adult Sinusitis is soreness and inflammation of your sinuses. Sinuses are hollow spaces in the bones around your face. Your sinuses are located:  Around your eyes.  In the middle of your forehead.  Behind your nose.  In your cheekbones.  Your sinuses and nasal passages are lined with a stringy fluid (mucus). Mucus normally drains out of your sinuses. When your nasal tissues become inflamed or swollen, the mucus can become trapped or blocked so air cannot flow through your sinuses. This allows bacteria, viruses, and funguses to grow, which leads to infection. Sinusitis can develop quickly and last for 7?10 days (acute) or for more than 12 weeks (chronic). Sinusitis often develops after a cold. What are the causes? This condition is caused by anything that creates swelling in the sinuses or stops mucus from draining, including:  Allergies.  Asthma.  Bacterial or viral infection.  Abnormally shaped bones between the nasal passages.  Nasal growths that contain mucus (nasal polyps).  Narrow sinus openings.  Pollutants, such as chemicals or irritants in the air.  A foreign object stuck in the nose.  A fungal infection. This is rare.  What increases the risk? The following factors may make you more likely to develop this condition:  Having allergies or asthma.  Having had a recent cold or respiratory tract infection.  Having structural deformities or blockages in your nose or sinuses.  Having  a weak immune system.  Doing a lot of swimming or diving.  Overusing nasal sprays.  Smoking.  What are the signs or symptoms? The main symptoms of this condition are pain and a feeling of pressure around the affected sinuses. Other symptoms include:  Upper toothache.  Earache.  Headache.  Bad breath.  Decreased sense of smell and taste.  A cough that may get worse at night.  Fatigue.  Fever.  Thick drainage from your nose. The drainage is often green and it may contain pus (purulent).  Stuffy nose or congestion.  Postnasal drip. This is when extra mucus collects in the throat or back of the nose.  Swelling and warmth over the affected sinuses.  Sore throat.  Sensitivity to light.  How is this diagnosed? This condition is diagnosed based on symptoms, a medical history, and a physical exam. To find out if your condition is acute or chronic, your health care provider may:  Look in your nose for signs of nasal polyps.  Tap over the affected sinus to check for signs of infection.  View the inside of your sinuses using an imaging device that has a light attached (endoscope).  If your health care provider suspects that you have chronic sinusitis, you may also:  Be tested for allergies.  Have a sample of mucus taken from your nose (nasal culture) and checked for bacteria.  Have a mucus sample examined to see if your sinusitis is related to an allergy.  If your sinusitis does not respond to treatment and  it lasts longer than 8 weeks, you may have an MRI or CT scan to check your sinuses. These scans also help to determine how severe your infection is. In rare cases, a bone biopsy may be done to rule out more serious types of fungal sinus disease. How is this treated? Treatment for sinusitis depends on the cause and whether your condition is chronic or acute. If a virus is causing your sinusitis, your symptoms will go away on their own within 10 days. You may be given  medicines to relieve your symptoms, including:  Topical nasal decongestants. They shrink swollen nasal passages and let mucus drain from your sinuses.  Antihistamines. These drugs block inflammation that is triggered by allergies. This can help to ease swelling in your nose and sinuses.  Topical nasal corticosteroids. These are nasal sprays that ease inflammation and swelling in your nose and sinuses.  Nasal saline washes. These rinses can help to get rid of thick mucus in your nose.  If your condition is caused by bacteria, you will be given an antibiotic medicine. If your condition is caused by a fungus, you will be given an antifungal medicine. Surgery may be needed to correct underlying conditions, such as narrow nasal passages. Surgery may also be needed to remove polyps. Follow these instructions at home: Medicines  Take, use, or apply over-the-counter and prescription medicines only as told by your health care provider. These may include nasal sprays.  If you were prescribed an antibiotic medicine, take it as told by your health care provider. Do not stop taking the antibiotic even if you start to feel better. Hydrate and Humidify  Drink enough water to keep your urine clear or pale yellow. Staying hydrated will help to thin your mucus.  Use a cool mist humidifier to keep the humidity level in your home above 50%.  Inhale steam for 10-15 minutes, 3-4 times a day or as told by your health care provider. You can do this in the bathroom while a hot shower is running.  Limit your exposure to cool or dry air. Rest  Rest as much as possible.  Sleep with your head raised (elevated).  Make sure to get enough sleep each night. General instructions  Apply a warm, moist washcloth to your face 3-4 times a day or as told by your health care provider. This will help with discomfort.  Wash your hands often with soap and water to reduce your exposure to viruses and other germs. If soap  and water are not available, use hand sanitizer.  Do not smoke. Avoid being around people who are smoking (secondhand smoke).  Keep all follow-up visits as told by your health care provider. This is important. Contact a health care provider if:  You have a fever.  Your symptoms get worse.  Your symptoms do not improve within 10 days. Get help right away if:  You have a severe headache.  You have persistent vomiting.  You have pain or swelling around your face or eyes.  You have vision problems.  You develop confusion.  Your neck is stiff.  You have trouble breathing. This information is not intended to replace advice given to you by your health care provider. Make sure you discuss any questions you have with your health care provider. Document Released: 06/28/2005 Document Revised: 02/22/2016 Document Reviewed: 04/23/2015 Elsevier Interactive Patient Education  [employer].

## 2017-08-03 NOTE — Progress Notes (Signed)
Careteam: Patient Care Team: Sharon SellerEubanks, Emerson Schreifels K, NP as PCP - General (Geriatric Medicine) Serena Colonelosen, Jefry, MD as Consulting Physician (Otolaryngology)  Advanced Directive information    No Known Allergies  Chief Complaint  Patient presents with  . Acute Visit    Pt is being seen due to sinus congestion x 3 weeks. Congestion is now starting to settle in pt chest. Pt has been taking mucinex fast max; cold, flu, and sore throat OTC medication.      HPI: Patient is a 72 y.o. female seen in the office today for complaints of cough and congestion times 3 weeks that has worsened over the past 24 hours.  Pt. Is coughing up clear secretions, tearing eyes, rhinorrhea, feeling some pressure in the maxillary sinus area.  Pt. Has been taking OTC Mucinex DM that helped in the beginning, but not over the last 24 hours.  Endorses feelings of fullness in head.  Denies headaches, SOB, chest pain, fever or chills.  Overall just feels awful and weak  Review of Systems:  Review of Systems  Constitutional: Positive for malaise/fatigue. Negative for chills and fever.  HENT: Positive for congestion and sinus pain. Negative for ear pain, hearing loss, sore throat and tinnitus.   Eyes: Positive for discharge.  Respiratory: Positive for cough.   Cardiovascular: Negative.  Negative for chest pain.  Gastrointestinal: Negative for heartburn, nausea and vomiting.  Musculoskeletal: Negative.   Neurological: Negative for dizziness and headaches.    Past Medical History:  Diagnosis Date  . Hearing loss   . High cholesterol   . Hypersomnia    [facility]   . Nocturia    [facility]   . Snoring    [facility]   . Thyroid disease    Past Surgical History:  Procedure Laterality Date  . CATARACT EXTRACTION, BILATERAL  07/12/2014  . CESAREAN SECTION     AGE 72  . COLONOSCOPY     10 YEARS AGO AS OF 2018    Social History:  reports that she has quit smoking. Her smoking use included cigarettes. She has a 3.00 pack-year smoking history. she has never used smokeless tobacco. She reports that she drinks alcohol. She reports that she does not use drugs.  Family History  Problem Relation Age of Onset  . Osteoporosis Mother   . Heart disease Father   . Cancer Father   . Heart failure Father     Medications: Patient's Medications  New Prescriptions   AMOXICILLIN-CLAVULANATE (AUGMENTIN) 875-125 MG TABLET    Take 1 tablet by mouth 2 (two) times daily.   FLUTICASONE (FLONASE) 50 MCG/ACT NASAL SPRAY    Place 2 sprays into both nostrils 2 (two) times daily.  Previous Medications   ATORVASTATIN (LIPITOR) 20 MG TABLET    Take 1 tablet (20 mg total) by mouth daily.   AZELASTINE HCL 0.15 % SOLN    Place 1 spray into the nose as needed.   CALCIUM CARBONATE (TUMS - DOSED IN MG ELEMENTAL CALCIUM) 500 MG CHEWABLE TABLET    Chew 1 tablet by mouth daily.   CHOLECALCIFEROL (VITAMIN D) 1000 UNITS TABLET    Take 1,000 Units by mouth daily. D3   LEVOTHYROXINE (SYNTHROID, LEVOTHROID) 75 MCG TABLET    Take 1 tablet (75 mcg total) by mouth daily before breakfast.   NAPROXEN SODIUM (ANAPROX) 220 MG TABLET    Take 220 mg by mouth as needed.  PHENYLEPHRINE-DM-GG-APAP (MUCINEX FAST-MAX COLD FLU) 5-10-200-325 MG TABS    Take 2 tablets by mouth every 6 (six) hours as needed.  Modified Medications   No medications on file  Discontinued Medications   No medications on file     Physical Exam:  Vitals:   08/03/17 0951  BP: 122/84  Pulse: 92  Temp: 98.7 F (37.1 C)  TempSrc: Oral  SpO2: 96%  Weight: 195 lb 3.2 oz (88.5 kg)  Height: 5\' 3"  (1.6 m)   Body mass index is 34.58 kg/m.  Physical Exam  Constitutional: She is oriented to person, place, and time. She appears well-nourished.  HENT:  Head: Normocephalic and atraumatic.  Right Ear: Tympanic membrane and ear canal normal. Decreased hearing is noted.  Left Ear: Tympanic  membrane and ear canal normal. Decreased hearing is noted.  Nose: Mucosal edema and rhinorrhea present. Right sinus exhibits maxillary sinus tenderness. Right sinus exhibits no frontal sinus tenderness. Left sinus exhibits maxillary sinus tenderness. Left sinus exhibits no frontal sinus tenderness.  Mouth/Throat: Uvula is midline and oropharynx is clear and moist. No oropharyngeal exudate, posterior oropharyngeal edema or posterior oropharyngeal erythema.  Eyes: EOM are normal. Pupils are equal, round, and reactive to light.  Neck: Normal range of motion. Neck supple.  Cardiovascular: Normal rate, regular rhythm, normal heart sounds and normal pulses.  Pulmonary/Chest: Effort normal and breath sounds normal. No respiratory distress. She has no wheezes.  Abdominal: Soft. Bowel sounds are normal.  Musculoskeletal: Normal range of motion. She exhibits edema (1+ right leg).  Lymphadenopathy:    She has no cervical adenopathy.  Neurological: She is alert and oriented to person, place, and time.  Skin: Skin is warm and dry.  Psychiatric: She has a normal mood and affect. Judgment normal.  Nursing note and vitals reviewed.   Labs reviewed: Basic Metabolic Panel: Recent Labs    09/14/16 03/30/17 1019  NA 138 141  K 4.3 4.4  CL  --  105  CO2  --  28  GLUCOSE  --  103  BUN 11 19  CREATININE 0.8 0.88  CALCIUM  --  9.2  TSH 1.86 2.22   Liver Function Tests: Recent Labs    09/14/16 03/30/17 1019  AST 21 23  ALT 20 20  ALKPHOS 65  --   BILITOT  --  0.4  PROT  --  6.7   No results for input(s): LIPASE, AMYLASE in the last 8760 hours. No results for input(s): AMMONIA in the last 8760 hours. CBC: Recent Labs    03/30/17 1019  WBC 6.2  NEUTROABS 4,551  HGB 13.7  HCT 40.6  MCV 92.9  PLT 281   Lipid Panel: Recent Labs    09/14/16  CHOL 179  HDL 53  LDLCALC 109  TRIG 87   TSH: Recent Labs    09/14/16 03/30/17 1019  TSH 1.86 2.22   A1C: No results found for:  HGBA1C   Assessment/Plan 1. Acute non-recurrent maxillary sinusitis - amoxicillin-clavulanate (AUGMENTIN) 875-125 MG tablet; Take 1 tablet by mouth 2 (two) times daily.  Dispense: 14 tablet; Refill: 0 Encouraged to finish full course of antibiotics therapy Encouraged adequate oral hydration   Mucinex DM PO twice a day for 7 days then as needed for cough and congetion with full glass of water Neti pot twice daily OTC nasal saline spray throughout the day as needed Flonase 1 spray into both nares twice daily May use OTC tylenol 650 mg every 6 hours as needed for aches,  pains, or sore throat OTC Claritin or Zyrtec 1 tablet daily  Avoid forcefully blowing nose Return precautions discussed   Next appt: 10/26/2017 with Dr Renato Gails as scheduled, sooner if needed  Mozes Sagar K. Biagio Borg  [facility] [phone] 8 am - 5 pm) [phone] (after hours)

## 2017-09-21 ENCOUNTER — Other Ambulatory Visit: Payer: Self-pay | Admitting: Nurse Practitioner

## 2017-09-21 DIAGNOSIS — E034 Atrophy of thyroid (acquired): Secondary | ICD-10-CM

## 2017-10-06 ENCOUNTER — Encounter: Payer: Self-pay | Admitting: Nurse Practitioner

## 2017-10-06 ENCOUNTER — Telehealth: Payer: Self-pay | Admitting: Nurse Practitioner

## 2017-10-06 NOTE — Telephone Encounter (Signed)
LVM that we changed appt from 10/26/17 @ 2 to 10/27/17 @1pm 

## 2017-10-26 ENCOUNTER — Ambulatory Visit: Payer: Medicare Other | Admitting: Internal Medicine

## 2017-10-27 ENCOUNTER — Ambulatory Visit (INDEPENDENT_AMBULATORY_CARE_PROVIDER_SITE_OTHER): Payer: Medicare Other | Admitting: Internal Medicine

## 2017-10-27 ENCOUNTER — Encounter: Payer: Self-pay | Admitting: Internal Medicine

## 2017-10-27 VITALS — BP 120/78 | HR 100 | Temp 97.6°F | Ht 63.0 in | Wt 197.0 lb

## 2017-10-27 DIAGNOSIS — G4733 Obstructive sleep apnea (adult) (pediatric): Secondary | ICD-10-CM | POA: Diagnosis not present

## 2017-10-27 DIAGNOSIS — E034 Atrophy of thyroid (acquired): Secondary | ICD-10-CM | POA: Diagnosis not present

## 2017-10-27 DIAGNOSIS — M1712 Unilateral primary osteoarthritis, left knee: Secondary | ICD-10-CM

## 2017-10-27 DIAGNOSIS — M8580 Other specified disorders of bone density and structure, unspecified site: Secondary | ICD-10-CM | POA: Diagnosis not present

## 2017-10-27 DIAGNOSIS — E782 Mixed hyperlipidemia: Secondary | ICD-10-CM | POA: Diagnosis not present

## 2017-10-27 DIAGNOSIS — Z9989 Dependence on other enabling machines and devices: Secondary | ICD-10-CM
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
## 2017-10-28 ENCOUNTER — Ambulatory Visit
Admission: RE | Admit: 2017-10-28 | Discharge: 2017-10-28 | Disposition: A | Discharge status: Home / Self Care | Payer: Medicare Other | Source: Ambulatory Visit | Attending: Internal Medicine | Admitting: Internal Medicine

## 2017-10-28 DIAGNOSIS — M1712 Unilateral primary osteoarthritis, left knee: Secondary | ICD-10-CM

## 2018-03-18 ENCOUNTER — Other Ambulatory Visit: Payer: Self-pay | Admitting: Nurse Practitioner

## 2018-03-18 DIAGNOSIS — E034 Atrophy of thyroid (acquired): Secondary | ICD-10-CM

## 2018-03-28 ENCOUNTER — Other Ambulatory Visit: Payer: Self-pay | Admitting: Nurse Practitioner

## 2018-03-28 DIAGNOSIS — E782 Mixed hyperlipidemia: Secondary | ICD-10-CM

## 2018-04-25 ENCOUNTER — Other Ambulatory Visit: Payer: Medicare Other

## 2018-04-25 DIAGNOSIS — E034 Atrophy of thyroid (acquired): Secondary | ICD-10-CM

## 2018-04-25 DIAGNOSIS — E782 Mixed hyperlipidemia: Secondary | ICD-10-CM | POA: Diagnosis not present

## 2018-04-25 LAB — LIPID PANEL
Cholesterol: 176 mg/dL (ref <200)
HDL: 54 mg/dL (ref >50)
LDL Cholesterol (Calc): 105 mg/dL (calc) — ABNORMAL HIGH
Non-HDL Cholesterol (Calc): 122 mg/dL (calc) (ref <130)
Total CHOL/HDL Ratio: 3.3 (calc) (ref <5.0)
Triglycerides: 82 mg/dL (ref <150)

## 2018-04-25 LAB — COMPLETE METABOLIC PANEL WITH GFR
AG Ratio: 1.4 (calc) (ref 1.0–2.5)
ALT: 20 U/L (ref 6–29)
AST: 23 U/L (ref 10–35)
Albumin: 3.7 g/dL (ref 3.6–5.1)
Alkaline phosphatase (APISO): 56 U/L (ref 33–130)
BUN: 16 mg/dL (ref 7–25)
CO2: 27 mmol/L (ref 20–32)
Calcium: 8.8 mg/dL (ref 8.6–10.4)
Chloride: 106 mmol/L (ref 98–110)
Creat: 0.93 mg/dL (ref 0.60–0.93)
GFR, Est African American: 71 mL/min/{1.73_m2} (ref >=60)
GFR, Est Non African American: 61 mL/min/{1.73_m2} (ref >=60)
Globulin: 2.6 g/dL (calc) (ref 1.9–3.7)
Glucose, Bld: 96 mg/dL (ref 65–99)
Potassium: 4.8 mmol/L (ref 3.5–5.3)
Sodium: 141 mmol/L (ref 135–146)
Total Bilirubin: 0.5 mg/dL (ref 0.2–1.2)
Total Protein: 6.3 g/dL (ref 6.1–8.1)

## 2018-04-25 LAB — CBC WITH DIFFERENTIAL/PLATELET
Basophils Absolute: 37 {cells}/uL (ref 0–200)
Basophils Relative: 0.6 %
Eosinophils Absolute: 267 {cells}/uL (ref 15–500)
Eosinophils Relative: 4.3 %
HCT: 39.4 % (ref 35.0–45.0)
Hemoglobin: 13.6 g/dL (ref 11.7–15.5)
Lymphs Abs: 1035 {cells}/uL (ref 850–3900)
MCH: 31.5 pg (ref 27.0–33.0)
MCHC: 34.5 g/dL (ref 32.0–36.0)
MCV: 91.2 fL (ref 80.0–100.0)
MPV: 11.2 fL (ref 7.5–12.5)
Monocytes Relative: 8.8 %
Neutro Abs: 4315 {cells}/uL (ref 1500–7800)
Neutrophils Relative %: 69.6 %
Platelets: 283 10*3/uL (ref 140–400)
RBC: 4.32 Million/uL (ref 3.80–5.10)
RDW: 12.1 % (ref 11.0–15.0)
Total Lymphocyte: 16.7 %
WBC mixed population: 546 {cells}/uL (ref 200–950)
WBC: 6.2 10*3/uL (ref 3.8–10.8)

## 2018-04-25 LAB — TSH: TSH: 3.08 mIU/L (ref 0.40–4.50)

## 2018-04-27 ENCOUNTER — Ambulatory Visit (INDEPENDENT_AMBULATORY_CARE_PROVIDER_SITE_OTHER): Payer: Medicare Other | Admitting: Nurse Practitioner

## 2018-04-27 ENCOUNTER — Encounter: Payer: Self-pay | Admitting: Nurse Practitioner

## 2018-04-27 ENCOUNTER — Ambulatory Visit: Payer: Medicare Other | Admitting: Internal Medicine

## 2018-04-27 VITALS — BP 122/78 | HR 105 | Temp 98.1°F | Ht 63.0 in | Wt 200.0 lb

## 2018-04-27 DIAGNOSIS — Z23 Encounter for immunization: Secondary | ICD-10-CM | POA: Diagnosis not present

## 2018-04-27 DIAGNOSIS — I499 Cardiac arrhythmia, unspecified: Secondary | ICD-10-CM

## 2018-04-27 DIAGNOSIS — G4733 Obstructive sleep apnea (adult) (pediatric): Secondary | ICD-10-CM

## 2018-04-27 DIAGNOSIS — M8580 Other specified disorders of bone density and structure, unspecified site: Secondary | ICD-10-CM | POA: Diagnosis not present

## 2018-04-27 DIAGNOSIS — E669 Obesity, unspecified: Secondary | ICD-10-CM

## 2018-04-27 DIAGNOSIS — Z1231 Encounter for screening mammogram for malignant neoplasm of breast: Secondary | ICD-10-CM | POA: Diagnosis not present

## 2018-04-27 DIAGNOSIS — E2839 Other primary ovarian failure: Secondary | ICD-10-CM | POA: Diagnosis not present

## 2018-04-27 DIAGNOSIS — Z9989 Dependence on other enabling machines and devices: Secondary | ICD-10-CM | POA: Diagnosis not present

## 2018-04-27 DIAGNOSIS — E66812 Obesity, class 2: Secondary | ICD-10-CM

## 2018-04-27 DIAGNOSIS — E034 Atrophy of thyroid (acquired): Secondary | ICD-10-CM

## 2018-04-27 DIAGNOSIS — E782 Mixed hyperlipidemia: Secondary | ICD-10-CM

## 2018-04-27 NOTE — Progress Notes (Signed)
Careteam: Patient Care Team: Sharon Seller, NP as PCP - General (Geriatric Medicine) Serena Colonel, MD as Consulting Physician (Otolaryngology)  Advanced Directive information    No Known Allergies  Chief Complaint  Patient presents with  . Medical Management of Chronic Issues    6 month follow-up, discuss labs (copy given to patient)   . Health Maintenance    Mammogram order pending for completion   . Immunizations    Will get flu vaccine today      HPI: Patient is a 72 y.o. female seen in the office today for routine follow up.   OSA on CPAP  Osteopenia- due to dexa scan. Continues on cal with d and more d.   Hyperlipidemia- continues on lipitor LDL 105  OA of right knee- doing well at this time. Complaints at last visit but 3-4 days later improved. Also had bout of sciatica which improved after a few days   Hypothyroid- in normal range at 3.08. Continues on synthroid 75 mcgs  Recently did the 3 mile womens only. Does zumba 3 days a week.     Review of Systems:  Review of Systems  Constitutional: Negative for chills and fever.  HENT: Negative for congestion and hearing loss.   Eyes: Negative for blurred vision.  Respiratory: Negative for cough and shortness of breath.   Cardiovascular: Negative for chest pain and palpitations.  Gastrointestinal: Negative for abdominal pain.  Genitourinary: Negative for dysuria.  Musculoskeletal: Negative for falls, joint pain and myalgias.  Skin: Negative for itching and rash.  Neurological: Negative for dizziness and loss of consciousness.  Endo/Heme/Allergies: Does not bruise/bleed easily.  Psychiatric/Behavioral: Negative for depression and memory loss. The patient is not nervous/anxious.     Past Medical History:  Diagnosis Date  . Hearing loss   . High cholesterol   . Hypersomnia    Per Records from [facility]   . Nocturia    Per Records from [facility]   . Snoring    Per Records from  [facility]   . Thyroid disease    Past Surgical History:  Procedure Laterality Date  . CATARACT EXTRACTION, BILATERAL  07/12/2014  . CESAREAN SECTION     AGE 70  . COLONOSCOPY     10 YEARS AGO AS OF 2018    Social History:   reports that she has quit smoking. Her smoking use included cigarettes. She has a 3.00 pack-year smoking history. She has never used smokeless tobacco. She reports that she drinks alcohol. She reports that she does not use drugs.  Family History  Problem Relation Age of Onset  . Osteoporosis Mother   . Heart disease Father   . Cancer Father   . Heart failure Father     Medications: Patient's Medications  New Prescriptions   No medications on file  Previous Medications   ATORVASTATIN (LIPITOR) 20 MG TABLET    TAKE 1 TABLET (20 MG TOTAL) BY MOUTH DAILY.   AZELASTINE HCL 0.15 % SOLN    Place 1 spray into the nose as needed.   CALCIUM CARBONATE (TUMS - DOSED IN MG ELEMENTAL CALCIUM) 500 MG CHEWABLE TABLET    Chew 1 tablet by mouth daily.   CHOLECALCIFEROL (VITAMIN D) 1000 UNITS TABLET    Take 1,000 Units by mouth daily. D3   FLUTICASONE (FLONASE) 50 MCG/ACT NASAL SPRAY    Place 2 sprays into both nostrils 2 (two) times daily.   LEVOTHYROXINE (SYNTHROID, LEVOTHROID) 75 MCG  TABLET    TAKE 1 TABLET (75 MCG TOTAL) BY MOUTH DAILY BEFORE BREAKFAST.   NAPROXEN SODIUM (ANAPROX) 220 MG TABLET    Take 220 mg by mouth as needed.   PHENYLEPHRINE-DM-GG-APAP (MUCINEX FAST-MAX COLD FLU) 5-10-200-325 MG TABS    Take 2 tablets by mouth every 6 (six) hours as needed.  Modified Medications   No medications on file  Discontinued Medications   No medications on file     Physical Exam:  Vitals:   04/27/18 1409  BP: 122/78  Pulse: (!) 105  Temp: 98.1 F (36.7 C)  TempSrc: Oral  SpO2: 97%  Weight: 200 lb (90.7 kg)  Height: 5\' 3"  (1.6 m)   Body mass index is 35.43 kg/m.  Physical Exam  Constitutional: She is oriented to person, place, and time. She appears  well-developed and well-nourished. No distress.  HENT:  Head: Normocephalic and atraumatic.  Mouth/Throat: Oropharynx is clear and moist. No oropharyngeal exudate.  Eyes: Pupils are equal, round, and reactive to light. Conjunctivae are normal.  Neck: Normal range of motion. Neck supple.  Cardiovascular: Normal rate and normal heart sounds. An irregular rhythm present.  Pulmonary/Chest: Effort normal and breath sounds normal.  Abdominal: Soft. Bowel sounds are normal.  Musculoskeletal: She exhibits no edema or tenderness.  Neurological: She is alert and oriented to person, place, and time.  Skin: Skin is warm and dry. She is not diaphoretic.  Psychiatric: She has a normal mood and affect.    Labs reviewed: Basic Metabolic Panel: Recent Labs    04/25/18 0817  NA 141  K 4.8  CL 106  CO2 27  GLUCOSE 96  BUN 16  CREATININE 0.93  CALCIUM 8.8  TSH 3.08   Liver Function Tests: Recent Labs    04/25/18 0817  AST 23  ALT 20  BILITOT 0.5  PROT 6.3   No results for input(s): LIPASE, AMYLASE in the last 8760 hours. No results for input(s): AMMONIA in the last 8760 hours. CBC: Recent Labs    04/25/18 0817  WBC 6.2  NEUTROABS 4,315  HGB 13.6  HCT 39.4  MCV 91.2  PLT 283   Lipid Panel: Recent Labs    04/25/18 0817  CHOL 176  HDL 54  LDLCALC 105*  TRIG 82  CHOLHDL 3.3   TSH: Recent Labs    04/25/18 0817  TSH 3.08   A1C: No results found for: HGBA1C   Assessment/Plan 1. Osteopenia, unspecified location -continues on calcium and vit d with weight bearing activity - DG Bone Density; Future  2. Estrogen deficiency - DG Bone Density; Future  3. Encounter for screening mammogram for breast cancer - MM DIGITAL SCREENING BILATERAL; Future  4. Hypothyroidism due to acquired atrophy of thyroid -TSH at goal, continue on synthroid 75 mcg - TSH; Future  5. OSA on CPAP Continues on CPAP  6. Mixed hyperlipidemia Continues on lipitor 20 mg, to continue  lifestyle modifications.  - COMPLETE METABOLIC PANEL WITH GFR; Future - Lipid Panel; Future - CBC with Differential/Platelets; Future  7. Obesity, Class II, BMI 35-39.9 -discussed diet changes, pt already exercising. Plans to join wellness program at gym in January.   8. Irregular heart beat - EKG 12-Lead- sinus with frequent PVCs. Recent labs normal including TSH. Rate of 99.  9. Need for influenza vaccination - Flu vaccine HIGH DOSE PF (Fluzone High dose)   Next appt:  6 months.   Ann Hamilton. Biagio Borg  [facility] [phone]

## 2018-05-15 ENCOUNTER — Ambulatory Visit (INDEPENDENT_AMBULATORY_CARE_PROVIDER_SITE_OTHER): Payer: Medicare Other

## 2018-05-15 VITALS — BP 140/82 | HR 100 | Temp 98.2°F | Ht 63.0 in | Wt 202.0 lb

## 2018-05-15 DIAGNOSIS — Z0000 Encounter for general adult medical examination without abnormal findings: Secondary | ICD-10-CM | POA: Diagnosis not present

## 2018-05-15 MED ORDER — ZOSTER VAC RECOMB ADJUVANTED 50 MCG/0.5ML IM SUSR: 0.5000 mL | Freq: Once | INTRAMUSCULAR | 1 refills | Status: AC

## 2018-05-15 NOTE — Progress Notes (Signed)
Subjective:   Ann Hamilton is a 72 y.o. female who presents for Medicare Annual (Subsequent) preventive examination.  Last AWV-05/05/2017    Objective:     Vitals: BP 140/82 (BP Location: Left Arm, Patient Position: Sitting)   Pulse 100   Temp 98.2 F (36.8 C) (Oral)   Ht 5\' 3"  (1.6 m)   Wt 202 lb (91.6 kg)   SpO2 98%   BMI 35.78 kg/m   Body mass index is 35.78 kg/m.  Advanced Directives 05/15/2018 10/27/2017 05/11/2017 05/05/2017 03/30/2017  Does Patient Have a Medical Advance Directive? Yes No No Yes Yes  Type of Estate agent of [place];Living will - - Healthcare Power of [place];Living will Healthcare Power of [place];Living will  Does patient want to make changes to medical advance directive? No - Patient declined - - No - Patient declined No - Patient declined  Copy of Healthcare Power of Attorney in Chart? No - copy requested - - No - copy requested No - copy requested  Would patient like information on creating a medical advance directive? - No - Patient declined - - -    Tobacco Social History   Tobacco Use  Smoking Status Former Smoker  . Packs/day: 1.00  . Years: 3.00  . Pack years: 3.00  . 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 Hearing loss   . High cholesterol   . Hypersomnia    Per Records from [facility]   . Nocturia    Per Records from [facility]   . Snoring    Per [facility] from [facility]   . Thyroid disease    Past Surgical History:  Procedure Laterality Date  . CATARACT EXTRACTION, BILATERAL  07/12/2014  . CESAREAN SECTION     AGE 20  . COLONOSCOPY     10 YEARS AGO AS OF 2018    Family History  Problem Relation Age of Onset  . Osteoporosis Mother   . Heart disease Father   . Cancer Father   . Heart failure Father    Social History   Socioeconomic History  . Marital status: Married    Spouse name: Not on file  . Number of children: Not on file  . Years of education: Not on file  . Highest education level: Not on file  Occupational History  . Not on file  Social Needs  . Financial resource strain: Not hard at all  . Food insecurity:    Worry: Never true    Inability: Never true  . Transportation needs:    Medical: No    Non-medical: No  Tobacco Use  . Smoking status: Former Smoker    Packs/day: 1.00    Years: 3.00    Pack years: 3.00    Types: Cigarettes  . Smokeless tobacco: Never Used  . Tobacco  comment: Quit 50 years ago as of 2019   Substance and Sexual Activity  . Alcohol use: Yes    Comment: 2 drinks weekly   . Drug use: No  . Sexual activity: Not Currently  Lifestyle  . Physical activity:    Days per week: 3 days    Minutes per session: 30 min  . Stress: Only a little  Relationships  . Social connections:    Talks on phone: More than three times a week    Gets together: More than three times a week    Attends religious service: More than 4 times per year    Active member of club or organization: Yes    Attends meetings of clubs or organizations: More than 4 times per year    Relationship status: Married  Other Topics Concern  . Not on file  Social History Narrative   Diet: No      Caffeine: Yes      Married, if yes what year: Yes, 1968      Do you live in a house, apartment, assisted living, condo, trailer, ect: Condo, one stories, 2 persons        Pets: No       Current/Past profession: Charity fundraiser      Exercise: Yes, 3 times weekly, [employer]         Living Will: Yes   DNR: No   POA/HPOA: No      Questions below were not included in New Patient Packet      Functional Status:   Do you have difficulty bathing or dressing yourself?   Do you have difficulty preparing food or eating?   Do you have difficulty managing your medications?   Do you have difficulty managing your finances?   Do you have difficulty affording your medications?    Outpatient Encounter Medications as of 05/15/2018  Medication Sig  . atorvastatin (LIPITOR) 20 MG tablet TAKE 1 TABLET (20 MG TOTAL) BY MOUTH DAILY.  Marland Kitchen Azelastine HCl 0.15 % SOLN Place 1 spray into the nose as needed.  . calcium carbonate (TUMS - DOSED IN MG ELEMENTAL CALCIUM) 500 MG chewable tablet Chew 1 tablet by mouth daily.  . cholecalciferol (VITAMIN D) 1000 units tablet Take 1,000 Units by mouth daily. D3  . fluticasone (FLONASE) 50 MCG/ACT nasal spray Place 2 sprays into both nostrils 2 (two) times daily.  Marland Kitchen levothyroxine (SYNTHROID, LEVOTHROID) 75 MCG tablet TAKE 1 TABLET (75 MCG TOTAL) BY MOUTH DAILY BEFORE BREAKFAST.  . naproxen sodium (ANAPROX) 220 MG tablet Take 220 mg by mouth as needed.  Marland Kitchen Phenylephrine-DM-GG-APAP (MUCINEX FAST-MAX COLD FLU) 5-10-200-325 MG TABS Take 2 tablets by mouth every 6 (six) hours as needed.  . Zoster Vaccine Adjuvanted [facility]) injection Inject 0.5 mLs into the muscle once for 1 dose.  . [DISCONTINUED] Zoster Vaccine Adjuvanted [facility]) injection Inject 0.5 mLs into the muscle once.   No facility-administered encounter medications on file as of 05/15/2018.     Activities of Daily Living In your present state of health, do you have any difficulty performing the following activities: 05/15/2018  Hearing? N  Vision? N  Difficulty concentrating or making decisions? N  Walking or climbing stairs? N  Dressing or bathing? N  Doing errands, shopping? N  Preparing  Food and eating ? N  Using the Toilet? N  In the past six months, have you accidently leaked urine? N  Do you have problems with loss of bowel control? N  Managing your Medications?  N  Managing your Finances? N  Housekeeping or managing your Housekeeping? N  Some recent data might be hidden    Patient Care Team: Sharon Seller, NP as PCP - General (Geriatric Medicine) Serena Colonel, MD as Consulting Physician (Otolaryngology)    Assessment:   This is a routine wellness examination for Breelle.  Exercise Activities and Dietary recommendations Current Exercise Habits: Structured exercise class, Type of exercise: Other - see comments(zumba), Time (Minutes): 30, Frequency (Times/Week): 3, Weekly Exercise (Minutes/Week): 90, Intensity: Mild, Exercise limited by: None identified  Goals    . Exercise 3x per week (30 min per time)     Patient will continue exercising 3 days a week.       Fall Risk Fall Risk  05/15/2018 04/27/2018 10/27/2017 08/03/2017 05/11/2017  Falls in the past year? 0 No No No No  Number falls in past yr: 0 - - - -  Injury with Fall? 0 - - - -   Is the patient's home free of loose throw rugs in walkways, pet beds, electrical cords, etc?   yes      Grab bars in the bathroom? yes      Handrails on the stairs?   yes      Adequate lighting?   yes  Depression Screen PHQ 2/9 Scores 05/15/2018 04/27/2018 03/30/2017  PHQ - 2 Score 0 0 0     Cognitive Function MMSE - Mini Mental State Exam 05/15/2018 05/05/2017  Orientation to time 4 5  Orientation to Place 5 5  Registration 3 3  Attention/ Calculation 5 5  Recall 3 3  Language- name 2 objects 2 2  Language- repeat 1 1  Language- follow 3 step command 3 3  Language- read & follow direction 1 1  Write a sentence 1 1  Copy design 1 1  Total score 29 30        Immunization History  Administered Date(s) Administered  . Influenza, High Dose Seasonal PF 03/30/2017, 04/27/2018  . Influenza-Unspecified 04/11/2016   . Pneumococcal Conjugate-13 06/21/2014  . Pneumococcal Polysaccharide-23 05/13/2010  . Td 07/12/2009    Qualifies for Shingles Vaccine? Yes, educated and ordered to pharmacy  Screening Tests Health Maintenance  Topic Date Due  . MAMMOGRAM  11/10/2017  . TETANUS/TDAP  07/13/2019  . Fecal DNA (Cologuard)  05/16/2020  . INFLUENZA VACCINE  Completed  . DEXA SCAN  Completed  . Hepatitis C Screening  Completed  . PNA vac Low Risk Adult  Completed    Cancer Screenings: Lung: Low Dose CT Chest recommended if Age 59-80 years, 30 pack-year currently smoking OR have quit w/in 15years. Patient does not qualify. Breast:  Up to date on Mammogram? Yes   Up to date of Bone Density/Dexa? Yes Colorectal: up to date  Additional Screenings:  Hepatitis C Screening: declined     Plan:    I have personally reviewed and addressed the Medicare Annual Wellness questionnaire and have noted the following in the patient's chart:  A. Medical and social history B. Use of alcohol, tobacco or illicit drugs  C. Current medications and supplements D. Functional ability and status E.  Nutritional status F.  Physical activity G. Advance directives H. List of other physicians I.  Hospitalizations, surgeries, and ER visits in previous 12 months J.  Vitals K. Screenings to include hearing, vision, cognitive, depression L. Referrals and appointments - none  In addition, I have reviewed and discussed with patient certain preventive protocols, quality metrics, and best practice  recommendations. A written personalized care plan for preventive services as well as general preventive health recommendations were provided to patient.  See attached scanned questionnaire for additional information.   Signed,   Tyron Russell, RN Nurse Health Advisor  Patient Concerns:None

## 2018-05-15 NOTE — Patient Instructions (Addendum)
Ann Hamilton , Thank you for taking time to come for your Medicare Wellness Visit. I appreciate your ongoing commitment to your health goals. Please review the following plan we discussed and let me know if I can assist you in the future.   Screening recommendations/referrals: Colonoscopy up to date, due 05/16/2020 Mammogram due, scheduled for december Bone Density up to date, due for december Recommended yearly ophthalmology/optometry visit for glaucoma screening and checkup Recommended yearly dental visit for hygiene and checkup  Vaccinations: Influenza vaccine up to date Pneumococcal vaccine up to date, completed Tdap vaccine up to date, due 07/13/2019 Shingles vaccine due, prescription sent to pharmacy    Advanced directives: Advance directive discussed with you today.Once this is complete please bring a copy in to our office so we can scan it into your chart.  Conditions/risks identified: none  Next appointment: [name], NP 73/20/2020 @ 2:15 pm           Tyron Russell, RN 73/12/2018 @ 8:30am   Preventive Care 73 Years and Older, Female Preventive care refers to lifestyle choices and visits with your health care provider that can promote health and wellness. What does preventive care include?  A yearly physical exam. This is also called an annual well check.  Dental exams once or twice a year.  Routine eye exams. Ask your health care provider how often you should have your eyes checked.  Personal lifestyle choices, including:  Daily care of your teeth and gums.  Regular physical activity.  Eating a healthy diet.  Avoiding tobacco and drug use.  Limiting alcohol use.  Practicing safe sex.  Taking low-dose aspirin every day.  Taking vitamin and mineral supplements as recommended by your health care provider. What happens during an annual well check? The services and screenings done by your health care provider during your annual well check will depend on your  age, overall health, lifestyle risk factors, and family history of disease. Counseling  Your health care provider may ask you questions about your:  Alcohol use.  Tobacco use.  Drug use.  Emotional well-being.  Home and relationship well-being.  Sexual activity.  Eating habits.  History of falls.  Memory and ability to understand (cognition).  Work and work Astronomer.  Reproductive health. Screening  You may have the following tests or measurements:  Height, weight, and BMI.  Blood pressure.  Lipid and cholesterol levels. These may be checked every 5 years, or more frequently if you are over 32 years old.  Skin check.  Lung cancer screening. You may have this screening every year starting at age 73 if you have a 30-pack-year history of smoking and currently smoke or have quit within the past 15 years.  Fecal occult blood test (FOBT) of the stool. You may have this test every year starting at age 73.  Flexible sigmoidoscopy or colonoscopy. You may have a sigmoidoscopy every 5 years or a colonoscopy every 10 years starting at age 52.  Hepatitis C blood test.  Hepatitis B blood test.  Sexually transmitted disease (STD) testing.  Diabetes screening. This is done by checking your blood sugar (glucose) after you have not eaten for a while (fasting). You may have this done every 1-3 years.  Bone density scan. This is done to screen for osteoporosis. You may have this done starting at age 73.  Mammogram. This may be done every 1-2 years. Talk to your health care provider about how often you should have regular mammograms. Talk with your health  care provider about your test results, treatment options, and if necessary, the need for more tests. Vaccines  Your health care provider may recommend certain vaccines, such as:  Influenza vaccine. This is recommended every year.  Tetanus, diphtheria, and acellular pertussis (Tdap, Td) vaccine. You may need a Td booster  every 10 years.  Zoster vaccine. You may need this after age 73.  Pneumococcal 13-valent conjugate (PCV13) vaccine. One dose is recommended after age 3.  Pneumococcal polysaccharide (PPSV23) vaccine. One dose is recommended after age 73. Talk to your health care provider about which screenings and vaccines you need and how often you need them. This information is not intended to replace advice given to you by your health care provider. Make sure you discuss any questions you have with your health care provider. Document Released: 07/25/2015 Document Revised: 03/17/2016 Document Reviewed: 04/29/2015 Elsevier Interactive Patient Education  2017 [place] Prevention in the Home Falls can cause injuries. They can happen to people of all ages. There are many things you can do to make your home safe and to help prevent falls. What can I do on the outside of my home?  Regularly fix the edges of walkways and driveways and fix any cracks.  Remove anything that might make you trip as you walk through a door, such as a raised step or threshold.  Trim any bushes or trees on the path to your home.  Use bright outdoor lighting.  Clear any walking paths of anything that might make someone trip, such as rocks or tools.  Regularly check to see if handrails are loose or broken. Make sure that both sides of any steps have handrails.  Any raised decks and porches should have guardrails on the edges.  Have any leaves, snow, or ice cleared regularly.  Use sand or salt on walking paths during winter.  Clean up any spills in your garage right away. This includes oil or grease spills. What can I do in the bathroom?  Use night lights.  Install grab bars by the toilet and in the tub and shower. Do not use towel bars as grab bars.  Use non-skid mats or decals in the tub or shower.  If you need to sit down in the shower, use a plastic, non-slip stool.  Keep the floor dry. Clean up any  water that spills on the floor as soon as it happens.  Remove soap buildup in the tub or shower regularly.  Attach bath mats securely with double-sided non-slip rug tape.  Do not have throw rugs and other things on the floor that can make you trip. What can I do in the bedroom?  Use night lights.  Make sure that you have a light by your bed that is easy to reach.  Do not use any sheets or blankets that are too big for your bed. They should not hang down onto the floor.  Have a firm chair that has side arms. You can use this for support while you get dressed.  Do not have throw rugs and other things on the floor that can make you trip. What can I do in the kitchen?  Clean up any spills right away.  Avoid walking on wet floors.  Keep items that you use a lot in easy-to-reach places.  If you need to reach something above you, use a strong step stool that has a grab bar.  Keep electrical cords out of the way.  Do not use floor  polish or wax that makes floors slippery. If you must use wax, use non-skid floor wax.  Do not have throw rugs and other things on the floor that can make you trip. What can I do with my stairs?  Do not leave any items on the stairs.  Make sure that there are handrails on both sides of the stairs and use them. Fix handrails that are broken or loose. Make sure that handrails are as long as the stairways.  Check any carpeting to make sure that it is firmly attached to the stairs. Fix any carpet that is loose or worn.  Avoid having throw rugs at the top or bottom of the stairs. If you do have throw rugs, attach them to the floor with carpet tape.  Make sure that you have a light switch at the top of the stairs and the bottom of the stairs. If you do not have them, ask someone to add them for you. What else can I do to help prevent falls?  Wear shoes that:  Do not have high heels.  Have rubber bottoms.  Are comfortable and fit you well.  Are closed  at the toe. Do not wear sandals.  If you use a stepladder:  Make sure that it is fully opened. Do not climb a closed stepladder.  Make sure that both sides of the stepladder are locked into place.  Ask someone to hold it for you, if possible.  Clearly mark and make sure that you can see:  Any grab bars or handrails.  First and last steps.  Where the edge of each step is.  Use tools that help you move around (mobility aids) if they are needed. These include:  Canes.  Walkers.  Scooters.  Crutches.  Turn on the lights when you go into a dark area. Replace any light bulbs as soon as they burn out.  Set up your furniture so you have a clear path. Avoid moving your furniture around.  If any of your floors are uneven, fix them.  If there are any pets around you, be aware of where they are.  Review your medicines with your doctor. Some medicines can make you feel dizzy. This can increase your chance of falling. Ask your doctor what other things that you can do to help prevent falls. This information is not intended to replace advice given to you by your health care provider. Make sure you discuss any questions you have with your health care provider. Document Released: 04/24/2009 Document Revised: 12/04/2015 Document Reviewed: 08/02/2014 Elsevier Interactive Patient Education  2017 [employer].

## 2018-05-26 ENCOUNTER — Ambulatory Visit (INDEPENDENT_AMBULATORY_CARE_PROVIDER_SITE_OTHER): Payer: Medicare Other | Admitting: Adult Health

## 2018-05-26 ENCOUNTER — Encounter: Payer: Self-pay | Admitting: Adult Health

## 2018-05-26 DIAGNOSIS — G4733 Obstructive sleep apnea (adult) (pediatric): Secondary | ICD-10-CM

## 2018-05-26 DIAGNOSIS — E669 Obesity, unspecified: Secondary | ICD-10-CM

## 2018-05-26 DIAGNOSIS — Z9989 Dependence on other enabling machines and devices: Secondary | ICD-10-CM | POA: Diagnosis not present

## 2018-05-26 NOTE — Patient Instructions (Signed)
Keep up the good work °Continue on CPAP at bedtime °Work on healthy weight °Remain active °Do not drive if sleepy °Follow-up in 1 year with Dr. Alva and as needed °

## 2018-05-26 NOTE — Assessment & Plan Note (Signed)
Wt loss  

## 2018-05-26 NOTE — Progress Notes (Signed)
@Patient  ID: Ann Hamilton, female    DOB: 11-24-1945, 72 y.o.   MRN: [id number]  Chief Complaint  Patient presents with  . Follow-up    OSA     Referring provider: Sharon Seller, NP  HPI: 72 year old female followed for moderate sleep apnea Retired Charity fundraiser  TEST/EVENTS :  in 2017 by PSG that showed AHI of 15/hour with lowest desaturation around 81%.    05/26/2018 Follow up ; OSA  Patient returns for a one-year follow-up.  Patient has underlying moderate sleep apnea.  She is on CPAP at bedtime.  Says she is doing very well.  Says she never misses a night and wearing her CPAP.  Says she feels rested with no significant daytime sleepiness.  She says she is very active.  Takes exercise classes throughout the week.  CPAP download shows excellent compliance with average daily usage at 8 hours.  AHI 1.8.  Patient is on CPAP 11 cm H2O.    No Known Allergies  Immunization History  Administered Date(s) Administered  . Influenza, High Dose Seasonal PF 03/30/2017, 04/27/2018  . Influenza-Unspecified 04/11/2016  . Pneumococcal Conjugate-13 06/21/2014  . Pneumococcal Polysaccharide-23 05/13/2010  . Td 07/12/2009    Past Medical History:  Diagnosis Date  . Hearing loss   . High cholesterol   . Hypersomnia    Per [facility] from [facility]   . Nocturia    Per [facility] from [facility]   . Snoring    Per [facility] from [facility]   . Thyroid disease     Tobacco History: Social History   Tobacco Use  Smoking Status Former Smoker  . Packs/day: 1.00  . Years: 3.00  . Pack years: 3.00  . Types: Cigarettes  Smokeless Tobacco Never Used  Tobacco Comment   Quit 50 years ago as of 2019    Counseling given: Not Answered Comment: Quit 50 years ago as of 2019    Outpatient Medications Prior to Visit  Medication Sig Dispense Refill  . atorvastatin (LIPITOR) 20 MG tablet TAKE 1 TABLET (20 MG TOTAL) BY MOUTH DAILY. 90 tablet 1  . Azelastine HCl 0.15 %  SOLN Place 1 spray into the nose as needed.    . calcium carbonate (TUMS - DOSED IN MG ELEMENTAL CALCIUM) 500 MG chewable tablet Chew 1 tablet by mouth daily.    . cholecalciferol (VITAMIN D) 1000 units tablet Take 1,000 Units by mouth daily. D3    . fluticasone (FLONASE) 50 MCG/ACT nasal spray Place 2 sprays into both nostrils 2 (two) times daily. 16 g 6  . levothyroxine (SYNTHROID, LEVOTHROID) 75 MCG tablet TAKE 1 TABLET (75 MCG TOTAL) BY MOUTH DAILY BEFORE BREAKFAST. 90 tablet 1  . naproxen sodium (ANAPROX) 220 MG tablet Take 220 mg by mouth as needed.    Marland Kitchen Phenylephrine-DM-GG-APAP (MUCINEX FAST-MAX COLD FLU) 5-10-200-325 MG TABS Take 2 tablets by mouth every 6 (six) hours as needed.     No facility-administered medications prior to visit.      Review of Systems  Constitutional:   No  weight loss, night sweats,  Fevers, chills, fatigue, or  lassitude.  HEENT:   No headaches,  Difficulty swallowing,  Tooth/dental problems, or  Sore throat,                No sneezing, itching, ear ache, nasal congestion, post nasal drip,   CV:  No chest pain,  Orthopnea, PND, swelling in lower extremities, anasarca, dizziness, palpitations, syncope.  GI  No heartburn, indigestion, abdominal pain, nausea, vomiting, diarrhea, change in bowel habits, loss of appetite, bloody stools.   Resp: No shortness of breath with exertion or at rest.  No excess mucus, no productive cough,  No non-productive cough,  No coughing up of blood.  No change in color of mucus.  No wheezing.  No chest wall deformity  Skin: no rash or lesions.  GU: no dysuria, change in color of urine, no urgency or frequency.  No flank pain, no hematuria   MS:  No joint pain or swelling.  No decreased range of motion.  No back pain.    Physical Exam  BP 132/90 (BP Location: Right Arm, Patient Position: Sitting, Cuff Size: Normal)   Pulse 99   Ht 5\' 3"  (1.6 m)   Wt 203 lb (92.1 kg)   SpO2 98%   BMI 35.96 kg/m   GEN: A/Ox3;  pleasant , NAD, obese   HEENT:  [place]/AT,  EACs-clear, TMs-wnl, NOSE-clear, THROAT-clear, no lesions, no postnasal drip or exudate noted.   NECK:  Supple w/ fair ROM; no JVD; normal carotid impulses w/o bruits; no thyromegaly or nodules palpated; no lymphadenopathy.    RESP  Clear  P & A; w/o, wheezes/ rales/ or rhonchi. no accessory muscle use, no dullness to percussion  CARD:  RRR, no m/r/g, no peripheral edema, pulses intact, no cyanosis or clubbing.  GI:   Soft & nt; nml bowel sounds; no organomegaly or masses detected.   Musco: Warm bil, no deformities or joint swelling noted.   Neuro: alert, no focal deficits noted.    Skin: Warm, no lesions or rashes    Lab Results:   BNP No results found for: BNP  ProBNP No results found for: PROBNP  Imaging: No results found.    No flowsheet data found.  No results found for: NITRICOXIDE      Assessment & Plan:   OSA on CPAP Excellent control and compliance on CPAP  Plan  Patient Instructions  Keep up the good work Continue on CPAP at bedtime Work on healthy weight Remain active Do not drive if sleepy Follow-up in 1 year with Dr. Vassie LollAlva and as needed     Obesity (BMI 30-39.9) Wt loss      Rubye Oaksammy Chrisma Hurlock, NP 05/26/2018

## 2018-05-26 NOTE — Assessment & Plan Note (Signed)
Excellent control and compliance on CPAP  Plan  Patient Instructions  Keep up the good work Continue on CPAP at bedtime Work on healthy weight Remain active Do not drive if sleepy Follow-up in 1 year with Dr. Vassie LollAlva and as needed

## 2018-06-15 DIAGNOSIS — Z961 Presence of intraocular lens: Secondary | ICD-10-CM | POA: Diagnosis not present

## 2018-07-10 ENCOUNTER — Ambulatory Visit
Admission: RE | Admit: 2018-07-10 | Discharge: 2018-07-10 | Disposition: A | Discharge status: Home / Self Care | Payer: Medicare Other | Source: Ambulatory Visit | Attending: Nurse Practitioner | Admitting: Nurse Practitioner

## 2018-07-10 ENCOUNTER — Ambulatory Visit: Payer: Medicare Other

## 2018-07-10 DIAGNOSIS — Z1231 Encounter for screening mammogram for malignant neoplasm of breast: Secondary | ICD-10-CM

## 2018-07-10 DIAGNOSIS — M85852 Other specified disorders of bone density and structure, left thigh: Secondary | ICD-10-CM | POA: Diagnosis not present

## 2018-07-10 DIAGNOSIS — Z780 Asymptomatic menopausal state: Secondary | ICD-10-CM | POA: Diagnosis not present

## 2018-07-10 DIAGNOSIS — M8580 Other specified disorders of bone density and structure, unspecified site: Secondary | ICD-10-CM

## 2018-07-10 DIAGNOSIS — E2839 Other primary ovarian failure: Secondary | ICD-10-CM

## 2018-08-01 ENCOUNTER — Other Ambulatory Visit: Payer: Self-pay | Admitting: Nurse Practitioner

## 2018-08-01 DIAGNOSIS — J0100 Acute maxillary sinusitis, unspecified: Secondary | ICD-10-CM

## 2018-09-20 ENCOUNTER — Other Ambulatory Visit: Payer: Self-pay | Admitting: Nurse Practitioner

## 2018-09-20 DIAGNOSIS — E034 Atrophy of thyroid (acquired): Secondary | ICD-10-CM

## 2018-09-22 ENCOUNTER — Other Ambulatory Visit: Payer: Self-pay | Admitting: Nurse Practitioner

## 2018-09-22 DIAGNOSIS — E782 Mixed hyperlipidemia: Secondary | ICD-10-CM

## 2018-10-26 ENCOUNTER — Other Ambulatory Visit: Payer: Self-pay

## 2018-10-26 ENCOUNTER — Encounter: Payer: Self-pay | Admitting: Nurse Practitioner

## 2018-10-26 ENCOUNTER — Ambulatory Visit (INDEPENDENT_AMBULATORY_CARE_PROVIDER_SITE_OTHER): Payer: Medicare Other | Admitting: Nurse Practitioner

## 2018-10-26 DIAGNOSIS — Z9109 Other allergy status, other than to drugs and biological substances: Secondary | ICD-10-CM

## 2018-10-26 DIAGNOSIS — M1712 Unilateral primary osteoarthritis, left knee: Secondary | ICD-10-CM

## 2018-10-26 DIAGNOSIS — E034 Atrophy of thyroid (acquired): Secondary | ICD-10-CM

## 2018-10-26 DIAGNOSIS — E782 Mixed hyperlipidemia: Secondary | ICD-10-CM | POA: Diagnosis not present

## 2018-10-26 DIAGNOSIS — G4733 Obstructive sleep apnea (adult) (pediatric): Secondary | ICD-10-CM | POA: Diagnosis not present

## 2018-10-26 DIAGNOSIS — M8580 Other specified disorders of bone density and structure, unspecified site: Secondary | ICD-10-CM

## 2018-10-26 DIAGNOSIS — Z9989 Dependence on other enabling machines and devices: Secondary | ICD-10-CM

## 2018-10-26 NOTE — Progress Notes (Signed)
This service is provided via telemedicine  No vital signs collected/recorded due to the encounter was a telemedicine visit.   Location of patient (ex: home, work):  Home  Patient consents to a telephone visit: Yes  Location of the provider (ex: office, home):  Office   Names of all persons participating in the telemedicine service and their role in the encounter:  Asher Muir CMA, [name] NP, Myrtie Cruise   Time spent on call:  Asher Muir CMA spent  5   Minutes with patient on phone.   Virtual Visit via Telephone Note  I connected with Ann Hamilton on 10/26/18 at  3:15 PM EDT by telephone and verified that I am speaking with the correct person using two identifiers.   I discussed the limitations, risks, security and privacy concerns of performing an evaluation and management service by telephone and the availability of in person appointments. I also discussed with the patient that there may be a patient responsible charge related to this service. The patient expressed understanding and agreed to proceed.    Careteam: Patient Care Team: Sharon Seller, NP as PCP - General (Geriatric Medicine) Serena Colonel, MD as Consulting Physician (Otolaryngology)  Advanced Directive information Does Patient Have a Medical Advance Directive?: No  No Known Allergies  Chief Complaint  Patient presents with  . Medical Management of Chronic Issues    6 month follow up      HPI: Patient is a 73 y.o. female for routine follow up.  Doing well during social isolation.  Keeping very structured  Walking around neighbor every day.   OSA on CPAP  Osteopenia-Continues on cal with d and more d. dexa scan done 06/2018  Hyperlipidemia- continues on lipitor LDL 105  OA of right knee- doing well at this time. Complaints at last visit but 3-4 days later improved. Also had bout of sciatica which improved after a few days   Hypothyroid- in normal range at  3.08. Continues on synthroid 75 mcg.  environmental allergies- somewhat worse at this time, using netipot and Flonase, occasional Claritin.   Granddaughter is studying public health and had warned her before things got bad  about what was going on.  Doing pick up for her food.   Review of Systems:  Review of Systems  Constitutional: Negative for chills and fever.  HENT: Negative for congestion and hearing loss.   Eyes: Negative for blurred vision.  Respiratory: Negative for cough and shortness of breath.   Cardiovascular: Negative for chest pain and palpitations.  Gastrointestinal: Negative for abdominal pain.  Genitourinary: Negative for dysuria.  Musculoskeletal: Negative for falls, joint pain and myalgias.  Skin: Negative for itching and rash.  Neurological: Negative for dizziness and loss of consciousness.  Endo/Heme/Allergies: Positive for environmental allergies. Does not bruise/bleed easily.  Psychiatric/Behavioral: Negative for depression and memory loss. The patient is not nervous/anxious.     Past Medical History:  Diagnosis Date  . Hearing loss   . High cholesterol   . Hypersomnia    Per [facility]   . Nocturia    Per Records from [facility]   . Snoring    Per [facility] from [facility]   . Thyroid disease    Past Surgical History:  Procedure Laterality Date  . CATARACT EXTRACTION, BILATERAL  07/12/2014  . CESAREAN SECTION     AGE 33  . COLONOSCOPY     10 YEARS AGO AS OF 2018  Social History:   reports that she has quit smoking. Her smoking use included cigarettes. She has a 3.00 pack-year smoking history. She has never used smokeless tobacco. She reports current alcohol use. She reports that she does not use drugs.  Family History  Problem Relation Age of Onset  . Osteoporosis Mother   . Heart disease Father   . Cancer Father   . Heart failure Father   . Breast cancer Neg Hx     Medications: Patient's  Medications  New Prescriptions   No medications on file  Previous Medications   ATORVASTATIN (LIPITOR) 20 MG TABLET    TAKE 1 TABLET (20 MG TOTAL) BY MOUTH DAILY.   CALCIUM CARBONATE (TUMS - DOSED IN MG ELEMENTAL CALCIUM) 500 MG CHEWABLE TABLET    Chew 1 tablet by mouth daily.   CHOLECALCIFEROL (VITAMIN D) 1000 UNITS TABLET    Take 1,000 Units by mouth daily. D3   FLUTICASONE (FLONASE) 50 MCG/ACT NASAL SPRAY    PLACE 2 SPRAYS INTO BOTH NOSTRILS 2 (TWO) TIMES DAILY.   LEVOTHYROXINE (SYNTHROID, LEVOTHROID) 75 MCG TABLET    TAKE 1 TABLET (75 MCG TOTAL) BY MOUTH DAILY BEFORE BREAKFAST.   NAPROXEN SODIUM (ANAPROX) 220 MG TABLET    Take 220 mg by mouth as needed.  Modified Medications   No medications on file  Discontinued Medications   AZELASTINE HCL 0.15 % SOLN    Place 1 spray into the nose as needed.   PHENYLEPHRINE-DM-GG-APAP (MUCINEX FAST-MAX COLD FLU) 5-10-200-325 MG TABS    Take 2 tablets by mouth every 6 (six) hours as needed.     Physical Exam: tele-visit unable to do physical exam.   Labs reviewed: Basic Metabolic Panel: Recent Labs    04/25/18 0817  NA 141  K 4.8  CL 106  CO2 27  GLUCOSE 96  BUN 16  CREATININE 0.93  CALCIUM 8.8  TSH 3.08   Liver Function Tests: Recent Labs    04/25/18 0817  AST 23  ALT 20  BILITOT 0.5  PROT 6.3   No results for input(s): LIPASE, AMYLASE in the last 8760 hours. No results for input(s): AMMONIA in the last 8760 hours. CBC: Recent Labs    04/25/18 0817  WBC 6.2  NEUTROABS 4,315  HGB 13.6  HCT 39.4  MCV 91.2  PLT 283   Lipid Panel: Recent Labs    04/25/18 0817  CHOL 176  HDL 54  LDLCALC 105*  TRIG 82  CHOLHDL 3.3   TSH: Recent Labs    04/25/18 0817  TSH 3.08   A1C: No results found for: HGBA1C   Assessment/Plan 1. Mixed hyperlipidemia LDL 105 on last labs, continues on lipitor 20 mg daily, continues activity with dietary modifications.   2. Osteopenia, unspecified location Stable, continues on cal  and vit d with weight bearing activity  3. Osteoarthritis of left knee, unspecified osteoarthritis type Controlled, uses naproxen PRN  4. Hypothyroidism due to acquired atrophy of thyroid TSH has been controlled, continues on synthroid   5. OSA on CPAP Stable, continues on CPAP  6. Environmental allergies -using netipot with Flonase twice daily. Occasionally will use Claritin if needed   Next appt: 04/23/2019  Shanda BumpsJessica K. Biagio BorgEubanks, AGNP  [facility] [phone]    Follow Up Instructions:    I discussed the assessment and treatment plan with the patient. The patient was provided an opportunity to ask questions and all were answered. The patient agreed with the plan and demonstrated  an understanding of the instructions.   The patient was advised to call back or seek an in-person evaluation if the symptoms worsen or if the condition fails to improve as anticipated.  I provided 11 minutes of non-face-to-face time during this encounter.   [name], NP

## 2018-10-30 ENCOUNTER — Ambulatory Visit: Payer: Medicare Other | Admitting: Nurse Practitioner

## 2019-01-02 DIAGNOSIS — H903 Sensorineural hearing loss, bilateral: Secondary | ICD-10-CM | POA: Diagnosis not present

## 2019-03-14 ENCOUNTER — Other Ambulatory Visit: Payer: Self-pay | Admitting: Nurse Practitioner

## 2019-03-14 DIAGNOSIS — E034 Atrophy of thyroid (acquired): Secondary | ICD-10-CM

## 2019-03-14 DIAGNOSIS — E782 Mixed hyperlipidemia: Secondary | ICD-10-CM

## 2019-04-23 ENCOUNTER — Other Ambulatory Visit: Payer: Self-pay

## 2019-04-23 ENCOUNTER — Other Ambulatory Visit: Payer: Medicare Other

## 2019-04-23 DIAGNOSIS — E782 Mixed hyperlipidemia: Secondary | ICD-10-CM

## 2019-04-23 DIAGNOSIS — E034 Atrophy of thyroid (acquired): Secondary | ICD-10-CM | POA: Diagnosis not present

## 2019-04-24 LAB — LIPID PANEL
Cholesterol: 191 mg/dL (ref <200)
HDL: 57 mg/dL (ref >=50)
LDL Cholesterol (Calc): 117 mg/dL (calc) — ABNORMAL HIGH
Non-HDL Cholesterol (Calc): 134 mg/dL (calc) — ABNORMAL HIGH (ref <130)
Total CHOL/HDL Ratio: 3.4 (calc) (ref <5.0)
Triglycerides: 76 mg/dL (ref <150)

## 2019-04-24 LAB — COMPLETE METABOLIC PANEL WITH GFR
AG Ratio: 1.3 (calc) (ref 1.0–2.5)
ALT: 17 U/L (ref 6–29)
AST: 19 U/L (ref 10–35)
Albumin: 3.7 g/dL (ref 3.6–5.1)
Alkaline phosphatase (APISO): 59 U/L (ref 37–153)
BUN/Creatinine Ratio: 20 (calc) (ref 6–22)
BUN: 19 mg/dL (ref 7–25)
CO2: 28 mmol/L (ref 20–32)
Calcium: 9.2 mg/dL (ref 8.6–10.4)
Chloride: 105 mmol/L (ref 98–110)
Creat: 0.96 mg/dL — ABNORMAL HIGH (ref 0.60–0.93)
GFR, Est African American: 68 mL/min/{1.73_m2} (ref >=60)
GFR, Est Non African American: 58 mL/min/{1.73_m2} — ABNORMAL LOW (ref >=60)
Globulin: 2.8 g/dL (calc) (ref 1.9–3.7)
Glucose, Bld: 94 mg/dL (ref 65–99)
Potassium: 4.4 mmol/L (ref 3.5–5.3)
Sodium: 140 mmol/L (ref 135–146)
Total Bilirubin: 0.6 mg/dL (ref 0.2–1.2)
Total Protein: 6.5 g/dL (ref 6.1–8.1)

## 2019-04-24 LAB — CBC WITH DIFFERENTIAL/PLATELET
Absolute Monocytes: 506 cells/uL (ref 200–950)
Basophils Absolute: 51 cells/uL (ref 0–200)
Basophils Relative: 0.8 %
Eosinophils Absolute: 211 cells/uL (ref 15–500)
Eosinophils Relative: 3.3 %
HCT: 39.9 % (ref 35.0–45.0)
Hemoglobin: 13.5 g/dL (ref 11.7–15.5)
Lymphs Abs: 1107 cells/uL (ref 850–3900)
MCH: 31.6 pg (ref 27.0–33.0)
MCHC: 33.8 g/dL (ref 32.0–36.0)
MCV: 93.4 fL (ref 80.0–100.0)
MPV: 10.9 fL (ref 7.5–12.5)
Monocytes Relative: 7.9 %
Neutro Abs: 4525 cells/uL (ref 1500–7800)
Neutrophils Relative %: 70.7 %
Platelets: 297 10*3/uL (ref 140–400)
RBC: 4.27 10*6/uL (ref 3.80–5.10)
RDW: 12.3 % (ref 11.0–15.0)
Total Lymphocyte: 17.3 %
WBC: 6.4 10*3/uL (ref 3.8–10.8)

## 2019-04-24 LAB — TSH: TSH: 2.92 mIU/L (ref 0.40–4.50)

## 2019-04-27 ENCOUNTER — Ambulatory Visit (INDEPENDENT_AMBULATORY_CARE_PROVIDER_SITE_OTHER): Payer: Medicare Other | Admitting: Nurse Practitioner

## 2019-04-27 ENCOUNTER — Encounter: Payer: Self-pay | Admitting: Nurse Practitioner

## 2019-04-27 ENCOUNTER — Other Ambulatory Visit: Payer: Self-pay

## 2019-04-27 VITALS — BP 132/88 | HR 86 | Temp 98.6°F | Ht 63.0 in | Wt 203.0 lb

## 2019-04-27 DIAGNOSIS — Z9989 Dependence on other enabling machines and devices: Secondary | ICD-10-CM

## 2019-04-27 DIAGNOSIS — Z23 Encounter for immunization: Secondary | ICD-10-CM

## 2019-04-27 DIAGNOSIS — M8580 Other specified disorders of bone density and structure, unspecified site: Secondary | ICD-10-CM

## 2019-04-27 DIAGNOSIS — E669 Obesity, unspecified: Secondary | ICD-10-CM

## 2019-04-27 DIAGNOSIS — E782 Mixed hyperlipidemia: Secondary | ICD-10-CM | POA: Diagnosis not present

## 2019-04-27 DIAGNOSIS — E034 Atrophy of thyroid (acquired): Secondary | ICD-10-CM | POA: Diagnosis not present

## 2019-04-27 DIAGNOSIS — M1712 Unilateral primary osteoarthritis, left knee: Secondary | ICD-10-CM | POA: Diagnosis not present

## 2019-04-27 DIAGNOSIS — G4733 Obstructive sleep apnea (adult) (pediatric): Secondary | ICD-10-CM

## 2019-04-27 NOTE — Progress Notes (Signed)
Careteam: Patient Care Team: Lauree Chandler, NP as PCP - General (Geriatric Medicine) Izora Gala, MD as Consulting Physician (Otolaryngology)  Advanced Directive information Does Patient Have a Medical Advance Directive?: No, Would patient like information on creating a medical advance directive?: No - Patient declined  No Known Allergies  Chief Complaint  Patient presents with  . Medical Management of Chronic Issues    6 month follow-up and discuss labs (copy printed)   . Immunizations    Flu vaccine      HPI: Patient is a 73 y.o. female seen in the office today for routine follow up.   Husband has been sick and granddaughter wearing a monitor.   OSA on CPAP- seeing pulmonary Dr Elsworth Soho yearly.   Osteopenia-Continues on cal with d and more d. dexa scan done 06/2018  Hyperlipidemia- continues on lipitor LDL up at 117 on recent labs  OA of right knee- doing well at this time. Knees will flare if she walks too much. Doing zumba   Hypothyroid- in normal range at 2.92. Continues on synthroid 75 mcg.  environmental allergies- stable. using netipot and Flonase and Claritin.   Hearing loss- had mold made for her aides and now hearing is greatly improved.   Review of Systems:  Review of Systems  Constitutional: Negative for chills and fever.  HENT: Negative for congestion and hearing loss.   Eyes: Negative for blurred vision.  Respiratory: Negative for cough and shortness of breath.   Cardiovascular: Negative for chest pain and palpitations.  Gastrointestinal: Negative for abdominal pain.  Genitourinary: Negative for dysuria.  Musculoskeletal: Negative for falls, joint pain and myalgias.  Skin: Negative for itching and rash.  Neurological: Negative for dizziness and loss of consciousness.  Endo/Heme/Allergies: Positive for environmental allergies. Does not bruise/bleed easily.  Psychiatric/Behavioral: Negative for depression and memory loss. The patient is not  nervous/anxious.     Past Medical History:  Diagnosis Date  . Hearing loss   . High cholesterol   . Hypersomnia    Per Records from [facility]   . Nocturia    Per Records from [facility]   . Snoring    Per [facility] from [facility]   . Thyroid disease    Past Surgical History:  Procedure Laterality Date  . CATARACT EXTRACTION, BILATERAL  07/12/2014  . CESAREAN SECTION     AGE 70  . COLONOSCOPY     10 YEARS AGO AS OF 2018    Social History:   reports that she has quit smoking. Her smoking use included cigarettes. She has a 3.00 pack-year smoking history. She has never used smokeless tobacco. She reports current alcohol use. She reports that she does not use drugs.  Family History  Problem Relation Age of Onset  . Osteoporosis Mother   . Heart disease Father   . Cancer Father   . Heart failure Father   . Breast cancer Neg Hx     Medications: Patient's Medications  New Prescriptions   No medications on file  Previous Medications   ATORVASTATIN (LIPITOR) 20 MG TABLET    TAKE 1 TABLET BY MOUTH EVERY DAY   CALCIUM CARBONATE (TUMS - DOSED IN MG ELEMENTAL CALCIUM) 500 MG CHEWABLE TABLET    Chew 1 tablet by mouth daily.   CHOLECALCIFEROL (VITAMIN D) 1000 UNITS TABLET    Take 1,000 Units by mouth daily. D3   FLUTICASONE (FLONASE) 50 MCG/ACT NASAL SPRAY    PLACE 2 SPRAYS INTO  BOTH NOSTRILS 2 (TWO) TIMES DAILY.   LEVOTHYROXINE (SYNTHROID) 75 MCG TABLET    TAKE 1 TABLET BY MOUTH EVERY DAY BEFORE BREAKFAST   NAPROXEN SODIUM (ANAPROX) 220 MG TABLET    Take 220 mg by mouth as needed.  Modified Medications   No medications on file  Discontinued Medications   No medications on file    Physical Exam:  Vitals:   04/27/19 0931  BP: 132/88  Pulse: 86  Temp: 98.6 F (37 C)  TempSrc: Temporal  SpO2: 98%  Weight: 203 lb (92.1 kg)  Height: 5\' 3"  (1.6 m)   Body mass index is 35.96 kg/m. Wt Readings from Last 3 Encounters:  05/26/18 203 lb (92.1 kg)   05/15/18 202 lb (91.6 kg)  04/27/18 200 lb (90.7 kg)    Physical Exam Constitutional:      General: She is not in acute distress.    Appearance: She is well-developed. She is not diaphoretic.  HENT:     Head: Normocephalic and atraumatic.     Mouth/Throat:     Pharynx: No oropharyngeal exudate.  Eyes:     Conjunctiva/sclera: Conjunctivae normal.     Pupils: Pupils are equal, round, and reactive to light.  Neck:     Musculoskeletal: Normal range of motion and neck supple.  Cardiovascular:     Rate and Rhythm: Normal rate. Rhythm irregular.     Heart sounds: Normal heart sounds.  Pulmonary:     Effort: Pulmonary effort is normal.     Breath sounds: Normal breath sounds.  Abdominal:     General: Bowel sounds are normal.     Palpations: Abdomen is soft.  Musculoskeletal:        General: No tenderness.  Skin:    General: Skin is warm and dry.  Neurological:     Mental Status: She is alert and oriented to person, place, and time.     Labs reviewed: Basic Metabolic Panel: Recent Labs    04/23/19 0823  NA 140  K 4.4  CL 105  CO2 28  GLUCOSE 94  BUN 19  CREATININE 0.96*  CALCIUM 9.2  TSH 2.92   Liver Function Tests: Recent Labs    04/23/19 0823  AST 19  ALT 17  BILITOT 0.6  PROT 6.5   No results for input(s): LIPASE, AMYLASE in the last 8760 hours. No results for input(s): AMMONIA in the last 8760 hours. CBC: Recent Labs    04/23/19 0823  WBC 6.4  NEUTROABS 4,525  HGB 13.5  HCT 39.9  MCV 93.4  PLT 297   Lipid Panel: Recent Labs    04/23/19 0823  CHOL 191  HDL 57  LDLCALC 117*  TRIG 76  CHOLHDL 3.4   TSH: Recent Labs    04/23/19 0823  TSH 2.92   A1C: No results found for: HGBA1C   Assessment/Plan 1. Need for influenza vaccination - Flu Vaccine QUAD High Dose(Fluad)  2. Mixed hyperlipidemia LDL slightly elevated but remains at goal on lipitor. Continue statin with dietary modifications.  3. Osteopenia, unspecified location  -continue on cal and vit d with weight bearing activities  4. Obesity (BMI 30-39.9) -unchanged, recommend weight loss through diet and exercise regimen. Exercise limited due to COVID and limited access to gym/exercise classes.  5. Osteoarthritis of left knee, unspecified osteoarthritis type Stable at this time, no recent flare  6. Hypothyroidism due to acquired atrophy of thyroid -tsh stable, continues on Levothroid 75 mcg  7. OSA on CPAP Ongoing, does  well on CPAP, has follow up with pulmonary scheduled  Next appt: 7 months with labs prior  Othelia Riederer K. Biagio Borg  [facility] [phone]

## 2019-04-27 NOTE — Patient Instructions (Signed)

## 2019-05-18 ENCOUNTER — Ambulatory Visit: Payer: Self-pay

## 2019-05-18 ENCOUNTER — Encounter: Payer: Self-pay | Admitting: Nurse Practitioner

## 2019-05-18 ENCOUNTER — Ambulatory Visit (INDEPENDENT_AMBULATORY_CARE_PROVIDER_SITE_OTHER): Payer: Medicare Other | Admitting: Nurse Practitioner

## 2019-05-18 ENCOUNTER — Other Ambulatory Visit: Payer: Self-pay

## 2019-05-18 DIAGNOSIS — Z0000 Encounter for general adult medical examination without abnormal findings: Secondary | ICD-10-CM | POA: Diagnosis not present

## 2019-05-18 NOTE — Progress Notes (Signed)
Subjective:   Ann Hamilton is a 73 y.o. female who presents for Medicare Annual (Subsequent) preventive examination.  Review of Systems:   Cardiac Risk Factors include: advanced age (>5155men, 40>65 women);dyslipidemia;obesity (BMI >30kg/m2);sedentary lifestyle     Objective:     Vitals: There were no vitals taken for this visit.  There is no height or weight on file to calculate BMI.  Advanced Directives 05/18/2019 04/27/2019 10/26/2018 05/15/2018 10/27/2017 05/11/2017 05/05/2017  Does Patient Have a Medical Advance Directive? Yes No;Yes No Yes No No Yes  Type of Advance Directive - Healthcare Power of [place];Living [place] - Healthcare [place];Living will - - Healthcare Power of [place];Living will  Does patient want to make changes to medical advance directive? - No - Patient declined - No - Patient declined - - No - Patient declined  Copy of Healthcare Power of Attorney in Chart? - No - copy requested - No - copy requested - - No - copy requested  Would patient like information on creating a medical advance directive? - - - - No - Patient declined - -    Tobacco Social History   Tobacco Use  Smoking Status Former Smoker  . Packs/day: 1.00  . Years: 3.00  . Pack years: 3.00  . 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 Hearing loss   . High cholesterol   .  Hypersomnia    [facility]   . Nocturia    [facility]   . Snoring    [facility]   . Thyroid disease    Past Surgical History:  Procedure Laterality Date  . CATARACT EXTRACTION, BILATERAL  07/12/2014  . CESAREAN SECTION     AGE 61  . COLONOSCOPY     10 YEARS AGO AS OF 2018    Family History  Problem Relation Age of Onset  . Osteoporosis Mother   . Heart disease Father   . Cancer Father   . Heart failure Father   . Breast cancer Neg Hx    Social History   Socioeconomic History  . Marital status: Married    Spouse name: Not on file  . Number of children: Not on file  . Years of education: Not on file  . Highest education level: Not on file  Occupational History  . Not on file  Social Needs  . Financial resource strain: Not hard at all  . Food insecurity    Worry: Never true    Inability: Never true  . Transportation needs    Medical: No    Non-medical: No  Tobacco Use  . Smoking status: Former Smoker    Packs/day: 1.00    Years: 3.00    Pack years: 3.00  Types: Cigarettes  . Smokeless tobacco: Never Used  . Tobacco comment: Quit 50 years ago as of 2019   Substance and Sexual Activity  . Alcohol use: Yes    Comment: 2 drinks weekly   . Drug use: No  . Sexual activity: Not Currently  Lifestyle  . Physical activity    Days per week: 3 days    Minutes per session: 30 min  . Stress: Only a little  Relationships  . Social connections    Talks on phone: More than three times a week    Gets together: More than three times a week    Attends religious service: More than 4 times per year    Active member of club or organization: Yes    Attends meetings of clubs or organizations: More than 4 times per year    Relationship status: Married  Other Topics Concern  . Not on file  Social History Narrative   Diet: No      Caffeine: Yes      Married, if yes what year: Yes, 1968       Do you live in a house, apartment, assisted living, condo, trailer, ect: Condo, one stories, 2 persons       Pets: No       Current/Past profession: Charity fundraiser      Exercise: Yes, 3 times weekly, [employer]         Living Will: Yes   DNR: No   POA/HPOA: No      Questions below were not included in New Patient Packet      Functional Status:   Do you have difficulty bathing or dressing yourself?   Do you have difficulty preparing food or eating?   Do you have difficulty managing your medications?   Do you have difficulty managing your finances?   Do you have difficulty affording your medications?    Outpatient Encounter Medications as of 05/18/2019  Medication Sig  . atorvastatin (LIPITOR) 20 MG tablet TAKE 1 TABLET BY MOUTH EVERY DAY  . Calcium Carb-Cholecalciferol (CALCIUM 1000 + D PO) Take 1 tablet by mouth daily.  . calcium carbonate (TUMS - DOSED IN MG ELEMENTAL CALCIUM) 500 MG chewable tablet Chew 1 tablet by mouth daily.  . cholecalciferol (VITAMIN D) 1000 units tablet Take 1,000 Units by mouth daily. D3  . fluticasone (FLONASE) 50 MCG/ACT nasal spray PLACE 2 SPRAYS INTO BOTH NOSTRILS 2 (TWO) TIMES DAILY.  Marland Kitchen levothyroxine (SYNTHROID) 75 MCG tablet TAKE 1 TABLET BY MOUTH EVERY DAY BEFORE BREAKFAST  . naproxen sodium (ANAPROX) 220 MG tablet Take 220 mg by mouth as needed.  Bertram Gala Glycol-Propyl Glycol (SYSTANE FREE OP) Apply 1-2 drops to eye 2 (two) times daily.   No facility-administered encounter medications on file as of 05/18/2019.     Activities of Daily Living In your present state of health, do you have any difficulty performing the following activities: 05/18/2019  Hearing? Y  Comment wears hearing aids  Vision? N  Difficulty concentrating or making decisions? N  Walking or climbing stairs? N  Dressing or bathing? N  Doing errands, shopping? N  Preparing Food and eating ? N  Using the Toilet? N  In the past six months, have you accidently leaked urine? N  Do you have  problems with loss of bowel control? N  Managing your Medications? N  Managing your Finances? N  Housekeeping or managing your Housekeeping? N  Some recent data might be hidden    Patient Care  Team: Sharon Seller, NP as PCP - General (Geriatric Medicine) Serena Colonel, MD as Consulting Physician (Otolaryngology)    Assessment:   This is a routine wellness examination for Ann Hamilton.  Exercise Activities and Dietary recommendations Current Exercise Habits: Structured exercise class;Home exercise routine, Type of exercise: strength training/weights;calisthenics;walking, Time (Minutes): 30, Frequency (Times/Week): 5, Weekly Exercise (Minutes/Week): 150, Intensity: Moderate  Goals    . Exercise 3x per week (30 min per time)     Patient will continue exercising 3 days a week.       Fall Risk Fall Risk  05/18/2019 04/27/2019 10/26/2018 05/15/2018 04/27/2018  Falls in the past year? 0 0 0 0 No  Number falls in past yr: - 0 0 0 -  Injury with Fall? - 0 0 0 -   Is the patient's home free of loose throw rugs in walkways, pet beds, electrical cords, etc?   yes      Grab bars in the bathroom? yes      Handrails on the stairs?   yes      Adequate lighting?   yes  Timed Get Up and Go performed: na  Depression Screen PHQ 2/9 Scores 05/18/2019 05/15/2018 04/27/2018 03/30/2017  PHQ - 2 Score 0 0 0 0     Cognitive Function MMSE - Mini Mental State Exam 05/15/2018 05/05/2017  Orientation to time 4 5  Orientation to Place 5 5  Registration 3 3  Attention/ Calculation 5 5  Recall 3 3  Language- name 2 objects 2 2  Language- repeat 1 1  Language- follow 3 step command 3 3  Language- read & follow direction 1 1  Write a sentence 1 1  Copy design 1 1  Total score 29 30     6CIT Screen 05/18/2019  What Year? 0 points  What month? 0 points  What time? 0 points  Count back from 20 0 points  Months in reverse 0 points  Repeat phrase 0 points  Total Score 0    Immunization History   Administered Date(s) Administered  . Fluad Quad(high Dose 65+) 04/27/2019  . Influenza, High Dose Seasonal PF 03/30/2017, 04/27/2018  . Influenza-Unspecified 04/11/2016  . Pneumococcal Conjugate-13 06/21/2014  . Pneumococcal Polysaccharide-23 05/13/2010  . Td 07/12/2009    Qualifies for Shingles Vaccine? Yes   Screening Tests Health Maintenance  Topic Date Due  . TETANUS/TDAP  07/13/2019  . Fecal DNA (Cologuard)  05/16/2020  . MAMMOGRAM  07/10/2020  . INFLUENZA VACCINE  Completed  . DEXA SCAN  Completed  . Hepatitis C Screening  Completed  . PNA vac Low Risk Adult  Completed    Cancer Screenings: Lung: Low Dose CT Chest recommended if Age 44-80 years, 30 pack-year currently smoking OR have quit w/in 15years. Patient does not qualify. Breast:  Up to date on Mammogram? Yes   Up to date of Bone Density/Dexa? Yes Colorectal: up to date  Additional Screenings:  Hepatitis C Screening: done     Plan:      I have personally reviewed and noted the following in the patient's chart:   . Medical and social history . Use of alcohol, tobacco or illicit drugs  . Current medications and supplements . Functional ability and status . Nutritional status . Physical activity . Advanced directives . List of other physicians . Hospitalizations, surgeries, and ER visits in previous 12 months . Vitals . Screenings to include cognitive, depression, and falls . Referrals and appointments  In addition, I have reviewed  and discussed with patient certain preventive protocols, quality metrics, and best practice recommendations. A written personalized care plan for preventive services as well as general preventive health recommendations were provided to patient.     Lauree Chandler, NP  05/18/2019

## 2019-05-18 NOTE — Progress Notes (Signed)
This service is provided via telemedicine  No vital signs collected/recorded due to the encounter was a telemedicine visit.   Location of patient (ex: home, work):  Home  Patient consents to a telephone visit:  Yes  Location of the provider (ex: office, home):  Office  Name of any referring provider:  N/A  Names of all persons participating in the telemedicine service and their role in the encounter:  Marisa Cyphers RMA, Sherrie Mustache NP, Patient Ann Hamilton  Time spent on call:  10 min.

## 2019-05-18 NOTE — Patient Instructions (Signed)
Ann Hamilton , Thank you for taking time to come for your Medicare Wellness Visit. I appreciate your ongoing commitment to your health goals. Please review the following plan we discussed and let me know if I can assist you in the future.   Screening recommendations/referrals: Colonoscopy up to date on colorectal screening Mammogram up to date Bone Density up to date Recommended yearly ophthalmology/optometry visit for glaucoma screening and checkup Recommended yearly dental visit for hygiene and checkup  Vaccinations: Influenza vaccine up to date Pneumococcal vaccine up to date Tdap vaccine up to date Shingles vaccine recommended- to get at local pharmacy    Advanced directives: recommended to bring to office to place on file  Conditions/risks identified: obesity, complications related to this.   Next appointment: 1 year   Preventive Care 1 Years and Older, Female Preventive care refers to lifestyle choices and visits with your health care provider that can promote health and wellness. What does preventive care include?  A yearly physical exam. This is also called an annual well check.  Dental exams once or twice a year.  Routine eye exams. Ask your health care provider how often you should have your eyes checked.  Personal lifestyle choices, including:  Daily care of your teeth and gums.  Regular physical activity.  Eating a healthy diet.  Avoiding tobacco and drug use.  Limiting alcohol use.  Practicing safe sex.  Taking low-dose aspirin every day.  Taking vitamin and mineral supplements as recommended by your health care provider. What happens during an annual well check? The services and screenings done by your health care provider during your annual well check will depend on your age, overall health, lifestyle risk factors, and family history of disease. Counseling  Your health care provider may ask you questions about your:  Alcohol use.  Tobacco use.   Drug use.  Emotional well-being.  Home and relationship well-being.  Sexual activity.  Eating habits.  History of falls.  Memory and ability to understand (cognition).  Work and work Statistician.  Reproductive health. Screening  You may have the following tests or measurements:  Height, weight, and BMI.  Blood pressure.  Lipid and cholesterol levels. These may be checked every 5 years, or more frequently if you are over 102 years old.  Skin check.  Lung cancer screening. You may have this screening every year starting at age 60 if you have a 30-pack-year history of smoking and currently smoke or have quit within the past 15 years.  Fecal occult blood test (FOBT) of the stool. You may have this test every year starting at age 32.  Flexible sigmoidoscopy or colonoscopy. You may have a sigmoidoscopy every 5 years or a colonoscopy every 10 years starting at age 1.  Hepatitis C blood test.  Hepatitis B blood test.  Sexually transmitted disease (STD) testing.  Diabetes screening. This is done by checking your blood sugar (glucose) after you have not eaten for a while (fasting). You may have this done every 1-3 years.  Bone density scan. This is done to screen for osteoporosis. You may have this done starting at age 73.  Mammogram. This may be done every 1-2 years. Talk to your health care provider about how often you should have regular mammograms. Talk with your health care provider about your test results, treatment options, and if necessary, the need for more tests. Vaccines  Your health care provider may recommend certain vaccines, such as:  Influenza vaccine. This is recommended every year.  Tetanus, diphtheria, and acellular pertussis (Tdap, Td) vaccine. You may need a Td booster every 10 years.  Zoster vaccine. You may need this after age 35.  Pneumococcal 13-valent conjugate (PCV13) vaccine. One dose is recommended after age 1.  Pneumococcal  polysaccharide (PPSV23) vaccine. One dose is recommended after age 57. Talk to your health care provider about which screenings and vaccines you need and how often you need them. This information is not intended to replace advice given to you by your health care provider. Make sure you discuss any questions you have with your health care provider. Document Released: 07/25/2015 Document Revised: 03/17/2016 Document Reviewed: 04/29/2015 Elsevier Interactive Patient Education  2017 [place] Prevention in the Home Falls can cause injuries. They can happen to people of all ages. There are many things you can do to make your home safe and to help prevent falls. What can I do on the outside of my home?  Regularly fix the edges of walkways and driveways and fix any cracks.  Remove anything that might make you trip as you walk through a door, such as a raised step or threshold.  Trim any bushes or trees on the path to your home.  Use bright outdoor lighting.  Clear any walking paths of anything that might make someone trip, such as rocks or tools.  Regularly check to see if handrails are loose or broken. Make sure that both sides of any steps have handrails.  Any raised decks and porches should have guardrails on the edges.  Have any leaves, snow, or ice cleared regularly.  Use sand or salt on walking paths during winter.  Clean up any spills in your garage right away. This includes oil or grease spills. What can I do in the bathroom?  Use night lights.  Install grab bars by the toilet and in the tub and shower. Do not use towel bars as grab bars.  Use non-skid mats or decals in the tub or shower.  If you need to sit down in the shower, use a plastic, non-slip stool.  Keep the floor dry. Clean up any water that spills on the floor as soon as it happens.  Remove soap buildup in the tub or shower regularly.  Attach bath mats securely with double-sided non-slip rug tape.   Do not have throw rugs and other things on the floor that can make you trip. What can I do in the bedroom?  Use night lights.  Make sure that you have a light by your bed that is easy to reach.  Do not use any sheets or blankets that are too big for your bed. They should not hang down onto the floor.  Have a firm chair that has side arms. You can use this for support while you get dressed.  Do not have throw rugs and other things on the floor that can make you trip. What can I do in the kitchen?  Clean up any spills right away.  Avoid walking on wet floors.  Keep items that you use a lot in easy-to-reach places.  If you need to reach something above you, use a strong step stool that has a grab bar.  Keep electrical cords out of the way.  Do not use floor polish or wax that makes floors slippery. If you must use wax, use non-skid floor wax.  Do not have throw rugs and other things on the floor that can make you trip. What can I do  with my stairs?  Do not leave any items on the stairs.  Make sure that there are handrails on both sides of the stairs and use them. Fix handrails that are broken or loose. Make sure that handrails are as long as the stairways.  Check any carpeting to make sure that it is firmly attached to the stairs. Fix any carpet that is loose or worn.  Avoid having throw rugs at the top or bottom of the stairs. If you do have throw rugs, attach them to the floor with carpet tape.  Make sure that you have a light switch at the top of the stairs and the bottom of the stairs. If you do not have them, ask someone to add them for you. What else can I do to help prevent falls?  Wear shoes that:  Do not have high heels.  Have rubber bottoms.  Are comfortable and fit you well.  Are closed at the toe. Do not wear sandals.  If you use a stepladder:  Make sure that it is fully opened. Do not climb a closed stepladder.  Make sure that both sides of the  stepladder are locked into place.  Ask someone to hold it for you, if possible.  Clearly mark and make sure that you can see:  Any grab bars or handrails.  First and last steps.  Where the edge of each step is.  Use tools that help you move around (mobility aids) if they are needed. These include:  Canes.  Walkers.  Scooters.  Crutches.  Turn on the lights when you go into a dark area. Replace any light bulbs as soon as they burn out.  Set up your furniture so you have a clear path. Avoid moving your furniture around.  If any of your floors are uneven, fix them.  If there are any pets around you, be aware of where they are.  Review your medicines with your doctor. Some medicines can make you feel dizzy. This can increase your chance of falling. Ask your doctor what other things that you can do to help prevent falls. This information is not intended to replace advice given to you by your health care provider. Make sure you discuss any questions you have with your health care provider. Document Released: 04/24/2009 Document Revised: 12/04/2015 Document Reviewed: 08/02/2014 Elsevier Interactive Patient Education  2017 [employer].

## 2019-05-21 ENCOUNTER — Ambulatory Visit (INDEPENDENT_AMBULATORY_CARE_PROVIDER_SITE_OTHER): Payer: Medicare Other | Admitting: Pulmonary Disease

## 2019-05-21 ENCOUNTER — Other Ambulatory Visit: Payer: Self-pay

## 2019-05-21 ENCOUNTER — Encounter: Payer: Self-pay | Admitting: Pulmonary Disease

## 2019-05-21 DIAGNOSIS — E669 Obesity, unspecified: Secondary | ICD-10-CM | POA: Diagnosis not present

## 2019-05-21 DIAGNOSIS — G4733 Obstructive sleep apnea (adult) (pediatric): Secondary | ICD-10-CM | POA: Diagnosis not present

## 2019-05-21 DIAGNOSIS — Z9989 Dependence on other enabling machines and devices: Secondary | ICD-10-CM | POA: Diagnosis not present

## 2019-05-21 NOTE — Patient Instructions (Signed)
CPAP is set at 11 cm and is working well. Supplies will be renewed as needed 

## 2019-05-21 NOTE — Progress Notes (Signed)
   Subjective:    Patient ID: Ann Hamilton, female    DOB: 10-28-1945, 73 y.o.   MRN: [id number]  HPI 73 year old woman for follow-up of moderate OSA  Chief Complaint  Patient presents with  . Follow-up    Patient reports that she uses her CPAP nightly with no problems and sleeps well.     She was set up with CPAP in 2017 and has settled with a full facemask. No problems with mask or pressure.  She feels refreshed and denies dryness of mouth. She has been coping well during the pandemic, has been masking and social distancing.  CPAP download was reviewed, she has a Respironics dream station, good control of events on 11 cm with mild leak and great compliance more than 8 hours every night and no missed nights  Weight is unchanged  Significant tests/ events reviewed  2017 NPSG - AHI of 15/hour with lowest desaturation around 81%.   Review of Systems Patient denies significant dyspnea,cough, hemoptysis,  chest pain, palpitations, pedal edema, orthopnea, paroxysmal nocturnal dyspnea, lightheadedness, nausea, vomiting, abdominal or  leg pains      Objective:   Physical Exam  Gen. Pleasant, well-nourished, in no distress ENT - no thrush, no pallor/icterus,no post nasal drip Neck: No JVD, no thyromegaly, no carotid bruits Lungs: no use of accessory muscles, no dullness to percussion, clear without rales or rhonchi  Cardiovascular: Rhythm regular, heart sounds  normal, no murmurs or gallops, no peripheral edema Musculoskeletal: No deformities, no cyanosis or clubbing        Assessment & Plan:

## 2019-05-21 NOTE — Assessment & Plan Note (Signed)
CPAP is set at 11 cm and is working well. She is compliant and CPAP is certainly helped improve her daytime somnolence and fatigue Supplies will be renewed as needed  Weight loss encouraged, compliance with goal of at least 4-6 hrs every night is the expectation. Advised against medications with sedative side effects Cautioned against driving when sleepy - understanding that sleepiness will vary on a day to day basis

## 2019-05-21 NOTE — Assessment & Plan Note (Signed)
Weight loss encouraged 

## 2019-06-18 DIAGNOSIS — H04123 Dry eye syndrome of bilateral lacrimal glands: Secondary | ICD-10-CM | POA: Diagnosis not present

## 2019-06-18 DIAGNOSIS — Z961 Presence of intraocular lens: Secondary | ICD-10-CM | POA: Diagnosis not present

## 2019-08-07 ENCOUNTER — Telehealth: Payer: Self-pay | Admitting: Pulmonary Disease

## 2019-08-07 DIAGNOSIS — Z9989 Dependence on other enabling machines and devices: Secondary | ICD-10-CM

## 2019-08-07 DIAGNOSIS — G4733 Obstructive sleep apnea (adult) (pediatric): Secondary | ICD-10-CM

## 2019-08-07 NOTE — Telephone Encounter (Signed)
Called pt and advised message from the provider. Pt understood and verbalized understanding. Nothing further is needed.   Order placed.  

## 2019-08-07 NOTE — Telephone Encounter (Signed)
Dr. Vassie Loll, can we send order for CPAP supplies?

## 2019-08-07 NOTE — Telephone Encounter (Signed)
She needs office visit with APP -televisit okay. Okay to send supplies

## 2019-08-09 ENCOUNTER — Ambulatory Visit: Payer: Medicare Other

## 2019-08-17 ENCOUNTER — Ambulatory Visit: Payer: Medicare Other | Attending: Internal Medicine

## 2019-08-17 DIAGNOSIS — Z23 Encounter for immunization: Secondary | ICD-10-CM | POA: Insufficient documentation

## 2019-08-17 NOTE — Progress Notes (Signed)
   Covid-19 Vaccination Clinic  Name:  Jmya Uliano    MRN: [id number] DOB: 1945/10/30  08/17/2019  Ms. Drakeford was observed post Covid-19 immunization for 15 minutes without incidence. She was provided with Vaccine Information Sheet and instruction to access the V-Safe system.   Ms. Garant was instructed to call 911 with any severe reactions post vaccine: Marland Kitchen Difficulty breathing  . Swelling of your face and throat  . A fast heartbeat  . A bad rash all over your body  . Dizziness and weakness    Immunizations Administered    Name Date Dose VIS Date Route   Pfizer COVID-19 Vaccine 08/17/2019  5:16 PM 0.3 mL 06/22/2019 Intramuscular   Manufacturer: [employer], [employer]   Lot: [id number]   NDC: [address]-6

## 2019-08-20 ENCOUNTER — Ambulatory Visit: Payer: Medicare Other

## 2019-09-07 ENCOUNTER — Other Ambulatory Visit: Payer: Self-pay | Admitting: Nurse Practitioner

## 2019-09-07 DIAGNOSIS — E782 Mixed hyperlipidemia: Secondary | ICD-10-CM

## 2019-09-07 DIAGNOSIS — E034 Atrophy of thyroid (acquired): Secondary | ICD-10-CM

## 2019-09-11 ENCOUNTER — Ambulatory Visit: Payer: Medicare Other | Attending: Internal Medicine

## 2019-09-11 DIAGNOSIS — Z23 Encounter for immunization: Secondary | ICD-10-CM

## 2019-09-11 NOTE — Progress Notes (Signed)
   Covid-19 Vaccination Clinic  Name:  Ann Hamilton    MRN: [id number] DOB: [birth year]  09/11/2019  Ann Hamilton was observed post Covid-19 immunization for 15 minutes without incident. She was provided with Vaccine Information Sheet and instruction to access the V-Safe system.   Ann Hamilton was instructed to call 911 with any severe reactions post vaccine: Marland Kitchen Difficulty breathing  . Swelling of face and throat  . A fast heartbeat  . A bad rash all over body  . Dizziness and weakness   Immunizations Administered    Name Date Dose VIS Date Route   Pfizer COVID-19 Vaccine 09/11/2019  3:26 PM 0.3 mL 06/22/2019 Intramuscular   Manufacturer: [employer], [employer]   Lot: [id number]   NDC: [address]-4

## 2019-09-13 IMAGING — MG DIGITAL SCREENING BILATERAL MAMMOGRAM WITH CAD
6 series · 6 of 6 positions shown · non-contrast
Comparison: None.

CLINICAL DATA: Screening.

EXAM:
DIGITAL SCREENING BILATERAL MAMMOGRAM WITH CAD

[R MLO (1 of 2)]
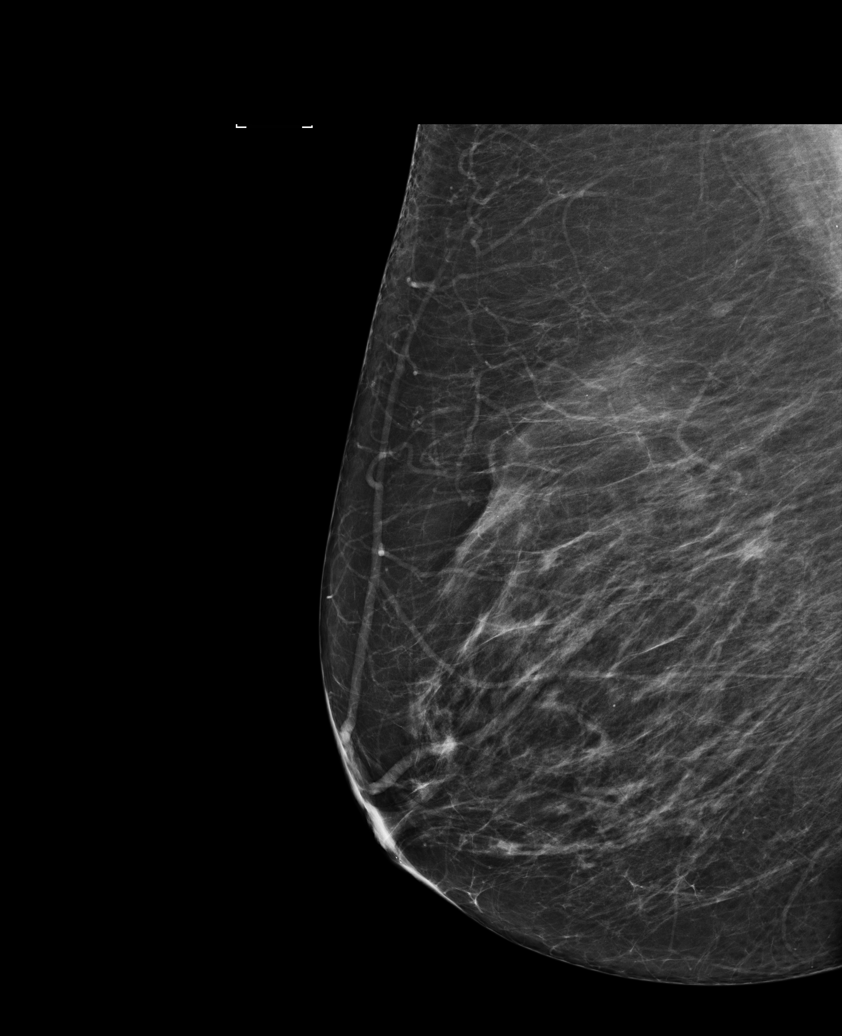

[R CV]
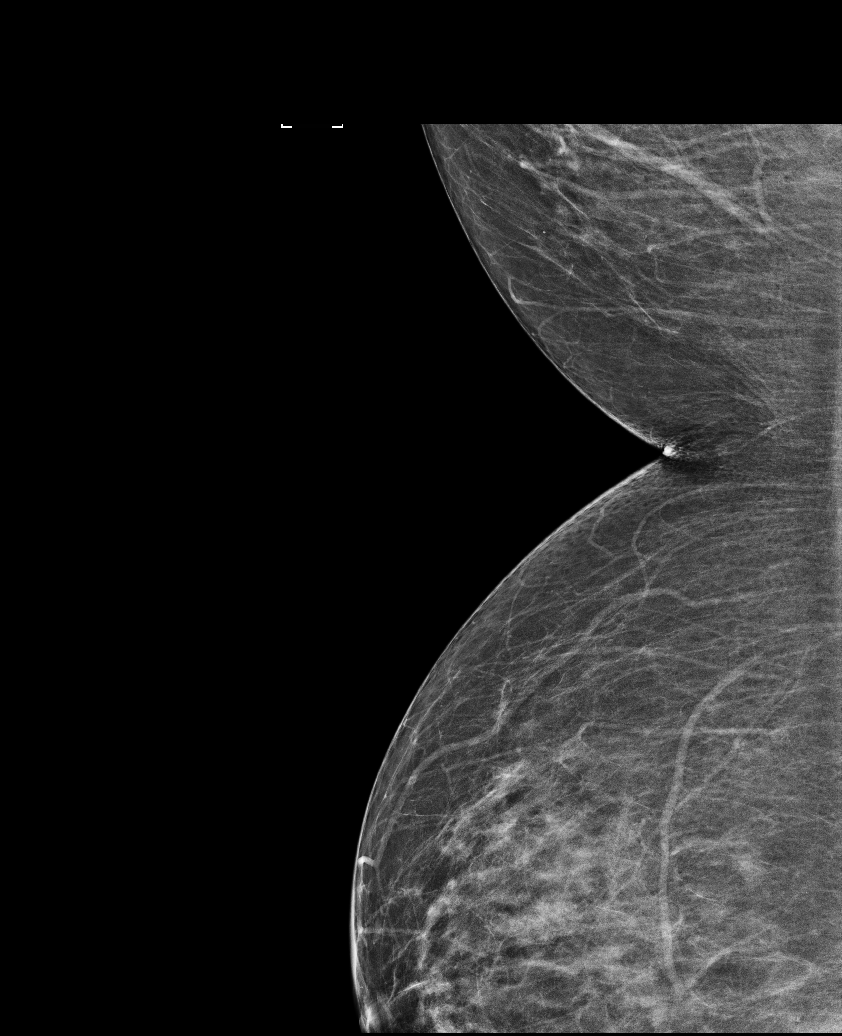

[L CC]
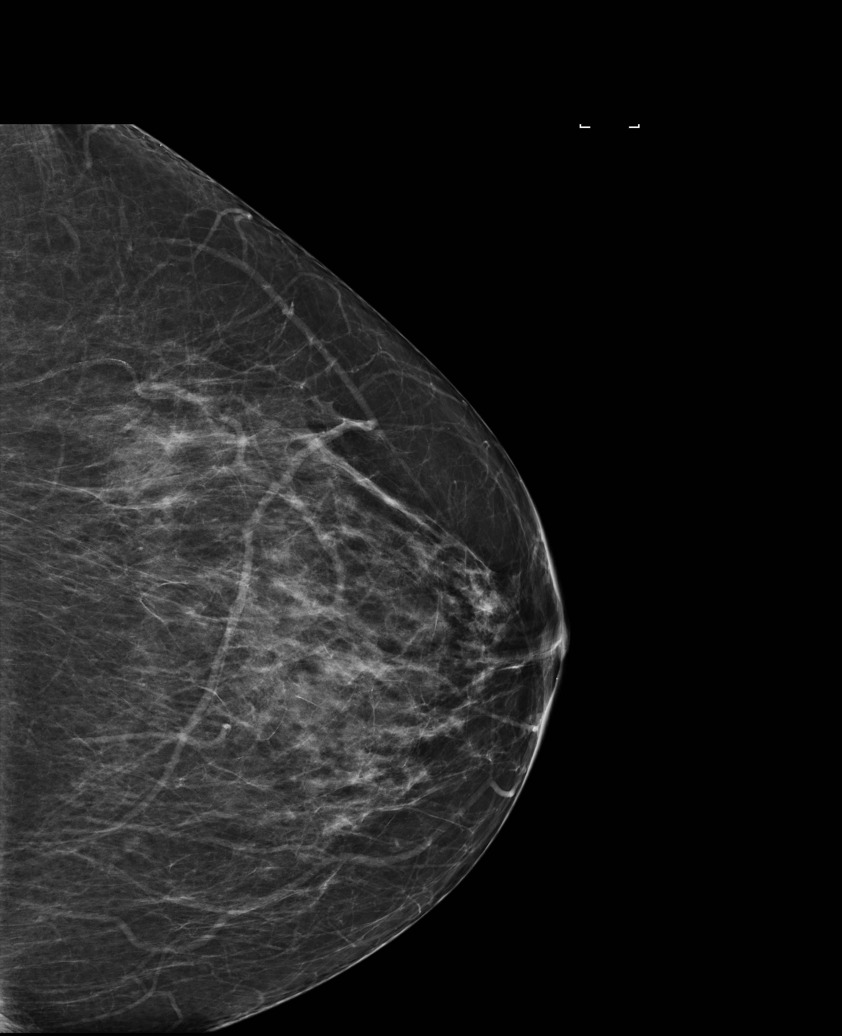

[R CC]
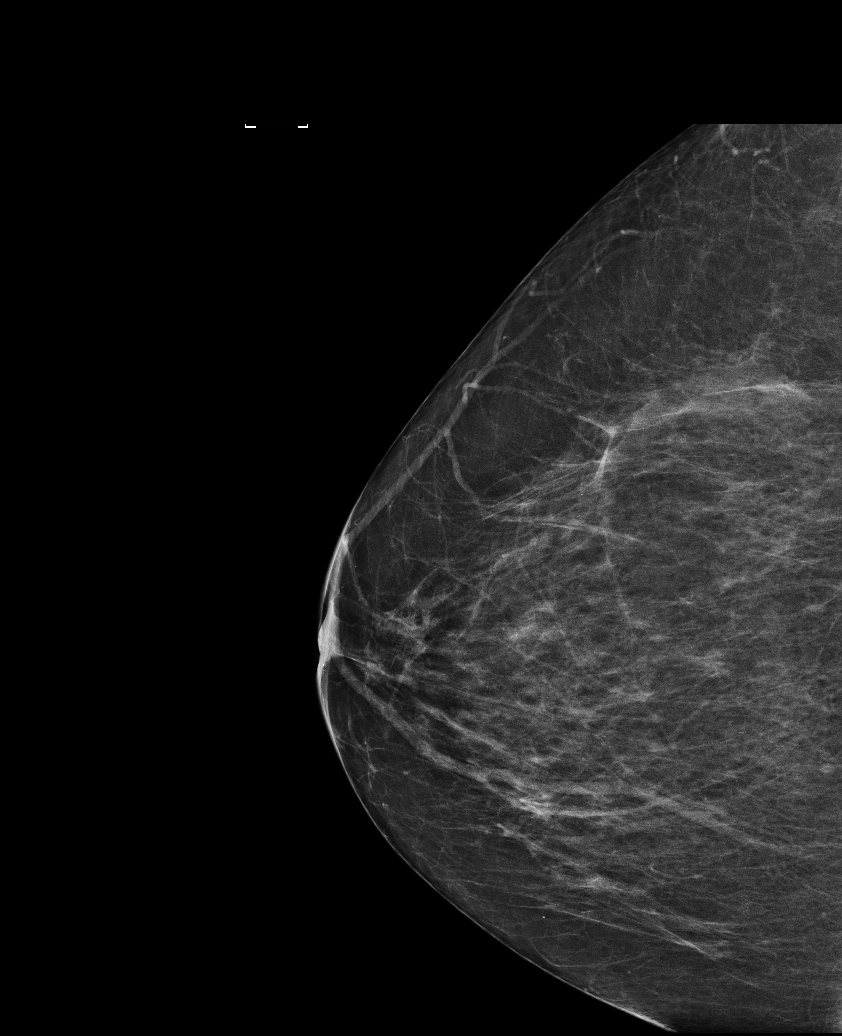

[R MLO (2 of 2)]
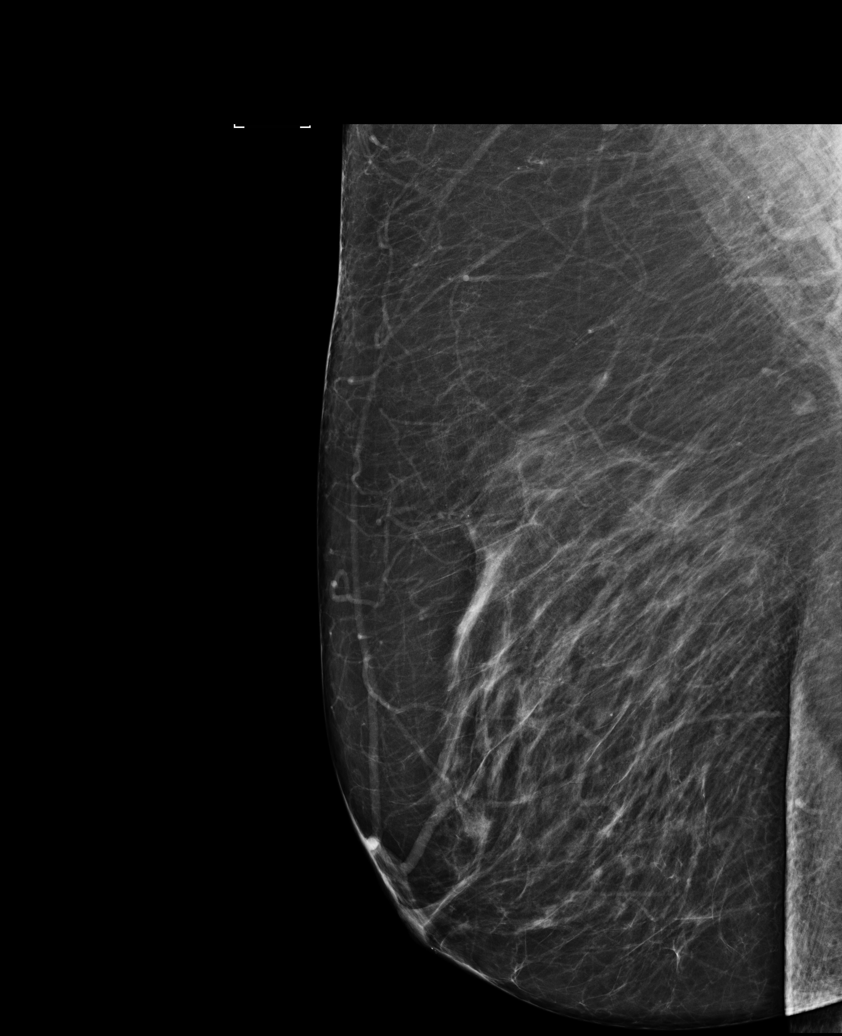

[L MLO]
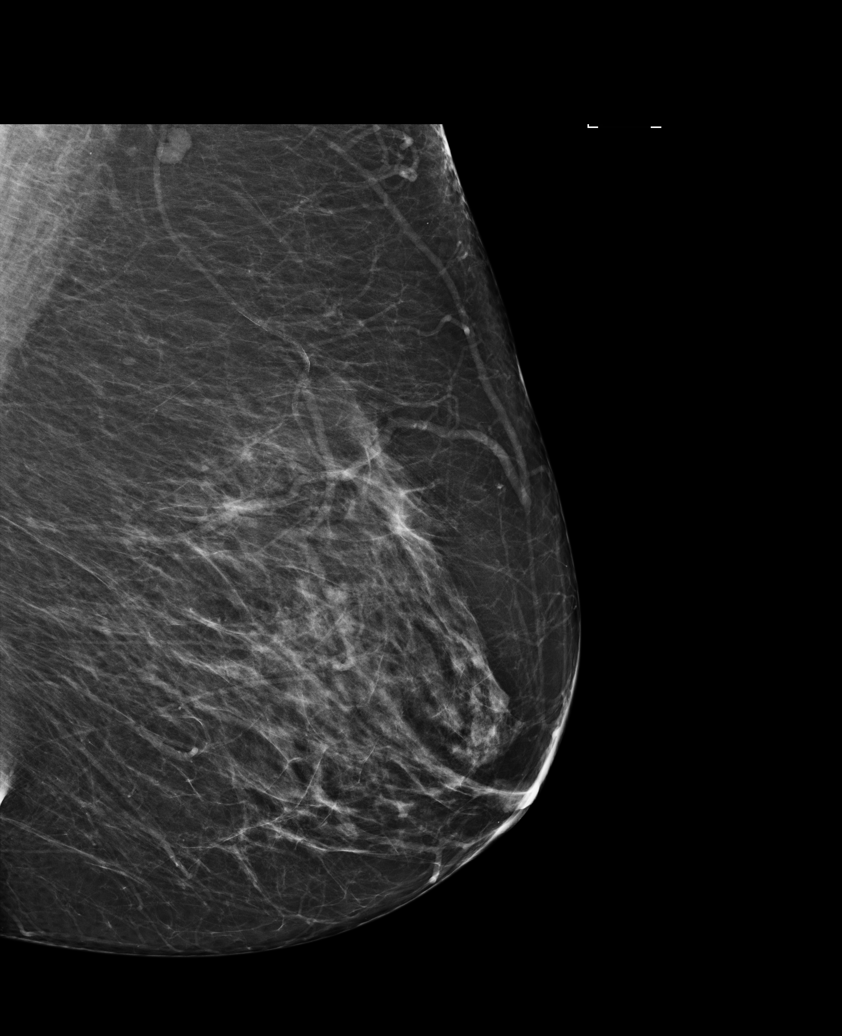

[6 of 6 positions shown; findings below may reference images not displayed]

ACR Breast Density Category b: There are scattered areas of
fibroglandular density.
FINDINGS: There are no findings suspicious for malignancy. Images were
processed with CAD.
IMPRESSION: No mammographic evidence of malignancy. A result letter of this
screening mammogram will be mailed directly to the patient.

RECOMMENDATION:
Screening mammogram in one year. (Code:[id number])

BI-RADS CATEGORY  1: Negative.

## 2019-11-22 ENCOUNTER — Other Ambulatory Visit: Payer: Self-pay

## 2019-11-22 ENCOUNTER — Other Ambulatory Visit: Payer: Medicare Other

## 2019-11-22 DIAGNOSIS — E782 Mixed hyperlipidemia: Secondary | ICD-10-CM

## 2019-11-23 LAB — COMPLETE METABOLIC PANEL WITH GFR
AG Ratio: 1.4 (calc) (ref 1.0–2.5)
ALT: 19 U/L (ref 6–29)
AST: 21 U/L (ref 10–35)
Albumin: 3.8 g/dL (ref 3.6–5.1)
Alkaline phosphatase (APISO): 63 U/L (ref 37–153)
BUN: 18 mg/dL (ref 7–25)
CO2: 27 mmol/L (ref 20–32)
Calcium: 9 mg/dL (ref 8.6–10.4)
Chloride: 108 mmol/L (ref 98–110)
Creat: 0.9 mg/dL (ref 0.60–0.93)
GFR, Est African American: 73 mL/min/{1.73_m2} (ref >=60)
GFR, Est Non African American: 63 mL/min/{1.73_m2} (ref >=60)
Globulin: 2.8 g/dL (calc) (ref 1.9–3.7)
Glucose, Bld: 93 mg/dL (ref 65–99)
Potassium: 4.8 mmol/L (ref 3.5–5.3)
Sodium: 142 mmol/L (ref 135–146)
Total Bilirubin: 0.5 mg/dL (ref 0.2–1.2)
Total Protein: 6.6 g/dL (ref 6.1–8.1)

## 2019-11-23 LAB — LIPID PANEL
Cholesterol: 177 mg/dL (ref <200)
HDL: 55 mg/dL (ref >=50)
LDL Cholesterol (Calc): 104 mg/dL (calc) — ABNORMAL HIGH
Non-HDL Cholesterol (Calc): 122 mg/dL (calc) (ref <130)
Total CHOL/HDL Ratio: 3.2 (calc) (ref <5.0)
Triglycerides: 89 mg/dL (ref <150)

## 2019-11-26 ENCOUNTER — Other Ambulatory Visit: Payer: Self-pay

## 2019-11-26 ENCOUNTER — Ambulatory Visit: Payer: Medicare Other | Admitting: Nurse Practitioner

## 2019-11-26 ENCOUNTER — Ambulatory Visit (INDEPENDENT_AMBULATORY_CARE_PROVIDER_SITE_OTHER): Payer: Medicare Other | Admitting: Nurse Practitioner

## 2019-11-26 ENCOUNTER — Encounter: Payer: Self-pay | Admitting: Nurse Practitioner

## 2019-11-26 VITALS — BP 130/82 | HR 80 | Temp 97.3°F | Ht 63.0 in | Wt 203.6 lb

## 2019-11-26 DIAGNOSIS — E785 Hyperlipidemia, unspecified: Secondary | ICD-10-CM

## 2019-11-26 DIAGNOSIS — M720 Palmar fascial fibromatosis [Dupuytren]: Secondary | ICD-10-CM

## 2019-11-26 DIAGNOSIS — M8580 Other specified disorders of bone density and structure, unspecified site: Secondary | ICD-10-CM | POA: Diagnosis not present

## 2019-11-26 DIAGNOSIS — Z9989 Dependence on other enabling machines and devices: Secondary | ICD-10-CM | POA: Diagnosis not present

## 2019-11-26 DIAGNOSIS — H9193 Unspecified hearing loss, bilateral: Secondary | ICD-10-CM

## 2019-11-26 DIAGNOSIS — N3281 Overactive bladder: Secondary | ICD-10-CM

## 2019-11-26 DIAGNOSIS — E669 Obesity, unspecified: Secondary | ICD-10-CM

## 2019-11-26 DIAGNOSIS — E034 Atrophy of thyroid (acquired): Secondary | ICD-10-CM

## 2019-11-26 DIAGNOSIS — J302 Other seasonal allergic rhinitis: Secondary | ICD-10-CM

## 2019-11-26 DIAGNOSIS — L409 Psoriasis, unspecified: Secondary | ICD-10-CM

## 2019-11-26 DIAGNOSIS — G4733 Obstructive sleep apnea (adult) (pediatric): Secondary | ICD-10-CM

## 2019-11-26 MED ORDER — MIRABEGRON ER 25 MG PO TB24: 25.0000 mg | ORAL_TABLET | Freq: Every day | ORAL | 1 refills | Status: DC

## 2019-11-26 MED ORDER — FLUTICASONE PROPIONATE 50 MCG/ACT NA SUSP: NASAL | 2 refills | Status: DC

## 2019-11-26 NOTE — Progress Notes (Signed)
Careteam: Patient Care Team: Lauree Chandler, NP as PCP - General (Geriatric Medicine) Izora Gala, MD as Consulting Physician (Otolaryngology)  PLACE OF SERVICE:  [place] Directive information Does Patient Have a Medical Advance Directive?: Yes, Type of Advance Directive: Living will, Does patient want to make changes to medical advance directive?: No - Patient declined  No Known Allergies  Chief Complaint  Patient presents with  . Medical Management of Chronic Issues    7 month follow-up and discuss labs (copy printed)   . Over Active Bladder    Ongoing concern, requesting treatment   . Hand Problem    Request referral for hand issues   . Skin Problem    Examine area of conern on skin on left lower leg  . Immunizations    Discuss need for TD/Tdap   . Medication Refill    Renew Nasal Spray     HPI: Patient is a 74 y.o. female for routine follow up.   Hyperlipidemia- LDL improved to 104 on last labs, HDL 55  Hypothyroid- on synthroid 75 mcg, TSH at goal on last labs.   Doing a move study through [facility]. Measuring how much she is sitting. She is meeting with them virtually. She is getting paid for this as well.   Went through a period were they were very isolated- her husband was diagnosised with bladder cancer and they were very cautious. Now having granddaughters come back over more and exercising.   Overactive bladder- reports she always has felt like she has had an OAB, was on medication in the past but did not feel like the cost was worth it. She was limiting her fluids due to the amount of time she was in the bathroom. It has now taken over more of her life  Reports she has contractures in bilateral pinkie fingers and hands have gotten much tighter. Previous PCP was monitoring and told her not much could be done until it got worse, feels like she is at the point for referral at this time.    Review of Systems:  Review of Systems  Constitutional:  Negative for chills, fever and weight loss.  HENT: Negative for tinnitus.   Respiratory: Negative for cough, sputum production and shortness of breath.   Cardiovascular: Negative for chest pain, palpitations and leg swelling.  Gastrointestinal: Negative for abdominal pain, constipation, diarrhea and heartburn.  Genitourinary: Positive for frequency. Negative for dysuria and urgency.  Musculoskeletal: Positive for joint pain. Negative for back pain, falls and myalgias.  Skin: Positive for rash.  Neurological: Negative for dizziness and headaches.  Psychiatric/Behavioral: Negative for depression and memory loss. The patient does not have insomnia.    Past Medical History:  Diagnosis Date  . Hearing loss   . High cholesterol   . Hypersomnia    Per Records from [facility]   . Nocturia    Per Records from [facility]   . Snoring    Per Records from [facility]   . Thyroid disease    Past Surgical History:  Procedure Laterality Date  . CATARACT EXTRACTION, BILATERAL  07/12/2014  . CESAREAN SECTION     AGE 29  . COLONOSCOPY     10 YEARS AGO AS OF 2018    Social History:   reports that she has quit smoking. Her smoking use included cigarettes. She has a 3.00 pack-year smoking history. She has never used smokeless tobacco. She reports current alcohol use. She  reports that she does not use drugs.  Family History  Problem Relation Age of Onset  . Osteoporosis Mother   . Heart disease Father   . Cancer Father   . Heart failure Father   . Breast cancer Neg Hx     Medications: Patient's Medications  New Prescriptions   MIRABEGRON ER (MYRBETRIQ) 25 MG TB24 TABLET    Take 1 tablet (25 mg total) by mouth daily.  Previous Medications   ATORVASTATIN (LIPITOR) 20 MG TABLET    TAKE 1 TABLET BY MOUTH EVERY DAY   CALCIUM CARB-CHOLECALCIFEROL (CALCIUM 1000 + D PO)    Take 1 tablet by mouth daily.   CALCIUM CARBONATE (TUMS - DOSED IN MG ELEMENTAL CALCIUM) 500 MG  CHEWABLE TABLET    Chew 1 tablet by mouth daily.   CHOLECALCIFEROL (VITAMIN D) 1000 UNITS TABLET    Take 1,000 Units by mouth daily. D3   LEVOTHYROXINE (SYNTHROID) 75 MCG TABLET    TAKE 1 TABLET BY MOUTH EVERY DAY BEFORE BREAKFAST   NAPROXEN SODIUM (ANAPROX) 220 MG TABLET    Take 220 mg by mouth as needed.   POLYETHYL GLYCOL-PROPYL GLYCOL (SYSTANE FREE OP)    Apply 1-2 drops to eye 2 (two) times daily.  Modified Medications   Modified Medication Previous Medication   FLUTICASONE (FLONASE) 50 MCG/ACT NASAL SPRAY fluticasone (FLONASE) 50 MCG/ACT nasal spray      PLACE 1 SPRAYS INTO BOTH NOSTRILS 2 (TWO) TIMES DAILY.    PLACE 2 SPRAYS INTO BOTH NOSTRILS 2 (TWO) TIMES DAILY.  Discontinued Medications   No medications on file    Physical Exam:  Vitals:   11/26/19 1106 11/26/19 1134  BP: (!) 148/90 130/82  Pulse: (!) 108 80  Temp: (!) 97.3 F (36.3 C)   TempSrc: Temporal   SpO2: 98%   Weight: 203 lb 9.6 oz (92.4 kg)   Height: 5' 3"  (1.6 m)    Body mass index is 36.07 kg/m. Wt Readings from Last 3 Encounters:  11/26/19 203 lb 9.6 oz (92.4 kg)  05/21/19 206 lb 3.2 oz (93.5 kg)  04/27/19 203 lb (92.1 kg)    Physical Exam Constitutional:      General: She is not in acute distress.    Appearance: She is well-developed. She is not diaphoretic.  HENT:     Head: Normocephalic and atraumatic.     Mouth/Throat:     Pharynx: No oropharyngeal exudate.  Eyes:     Conjunctiva/sclera: Conjunctivae normal.     Pupils: Pupils are equal, round, and reactive to light.  Cardiovascular:     Rate and Rhythm: Normal rate and regular rhythm. Frequent extrasystoles are present.    Heart sounds: Normal heart sounds.  Pulmonary:     Effort: Pulmonary effort is normal.     Breath sounds: Normal breath sounds.  Abdominal:     General: Bowel sounds are normal.     Palpations: Abdomen is soft.  Musculoskeletal:        General: No tenderness.     Cervical back: Normal range of motion and neck  supple.  Skin:    General: Skin is warm and dry.  Neurological:     Mental Status: She is alert and oriented to person, place, and time.     Labs reviewed: Basic Metabolic Panel: Recent Labs    04/23/19 0823 11/22/19 0801  NA 140 142  K 4.4 4.8  CL 105 108  CO2 28 27  GLUCOSE 94 93  BUN 19 18  CREATININE 0.96* 0.90  CALCIUM 9.2 9.0  TSH 2.92  --    Liver Function Tests: Recent Labs    04/23/19 0823 11/22/19 0801  AST 19 21  ALT 17 19  BILITOT 0.6 0.5  PROT 6.5 6.6   No results for input(s): LIPASE, AMYLASE in the last 8760 hours. No results for input(s): AMMONIA in the last 8760 hours. CBC: Recent Labs    04/23/19 0823  WBC 6.4  NEUTROABS 4,525  HGB 13.5  HCT 39.9  MCV 93.4  PLT 297   Lipid Panel: Recent Labs    04/23/19 0823 11/22/19 0801  CHOL 191 177  HDL 57 55  LDLCALC 117* 104*  TRIG 76 89  CHOLHDL 3.4 3.2   TSH: Recent Labs    04/23/19 0823  TSH 2.92   A1C: No results found for: HGBA1C   Assessment/Plan 1. Overactive bladder - mirabegron ER (MYRBETRIQ) 25 MG TB24 tablet; Take 1 tablet (25 mg total) by mouth daily.  Dispense: 30 tablet; Refill: 1  2. Dupuytren's contracture of both hands Worsening contractures  - AMB referral to orthopedics, hand specialist  3. Seasonal allergies -controlled with flonase - fluticasone (FLONASE) 50 MCG/ACT nasal spray; PLACE 1 SPRAYS INTO BOTH NOSTRILS 2 (TWO) TIMES DAILY.  Dispense: 48 g; Refill: 2  4. Bilateral hearing loss, unspecified hearing loss type -following with audiologist with good results, has hearing aides  5. Hyperlipidemia LDL goal <100 -continues on lipitor 20 mg daily with dietary modifications - Lipid Panel; Future - CBC with Differential/Platelet; Future - CMP with eGFR([facility]); Future  6. Psoriasis -small spot on leg, previously had Rx cream but now with OTC hydrocortisone, to notify if area worsens or does not improve, may need Rx.  7. OSA on CPAP -ongoing, stable.     8. Hypothyroidism due to acquired atrophy of thyroid TSh at goal on last labs, continues on synthroid 75 mcg - TSH; Future  9. Osteopenia, unspecified location Continues with cal and vit d  10. Obesity (BMI 30-39.9) -ongoing, plans to increase physical activity, active in a study to tract her activity.   Next appt: 6 months, labs prior  Ranjit Ashurst K. Miller, [facility] [phone]

## 2019-11-27 ENCOUNTER — Encounter: Payer: Self-pay | Admitting: Nurse Practitioner

## 2019-11-27 MED ORDER — SOLIFENACIN SUCCINATE 5 MG PO TABS
5.0000 mg | ORAL_TABLET | Freq: Every day | ORAL | 1 refills | Status: DC
Start: 2019-11-27 — End: 2020-01-07

## 2019-11-27 NOTE — Telephone Encounter (Signed)
Routed message to PCP Sharon Seller, NP. Please Advise.

## 2019-12-06 ENCOUNTER — Telehealth: Payer: Self-pay

## 2019-12-06 NOTE — Telephone Encounter (Signed)
Staff message converted to telephone encounter for documentation purposes

## 2019-12-06 NOTE — Telephone Encounter (Signed)
-----   Message from Dicky Doe, [place] sent at 12/06/2019  4:44 PM EDT ----- Regarding: PA Approval Patient PA has been ongoing for 1 week. Patient has been patiently waiting for PA Approval. I called "[employer] [employer] [facility]" and "[facility]" to resolve PA issues. It's been multiple back and forths to resolve this issue. Patient has multiple attempts from me to complete PA but has been getting rejections. I called "[facility]" and they've agreed to send PA Approval to be filled out manually for patient and have faxed back to complete. If this paper is found in Faxes please let me know.

## 2019-12-13 ENCOUNTER — Telehealth: Payer: Self-pay

## 2019-12-13 ENCOUNTER — Encounter: Payer: Self-pay | Admitting: Nurse Practitioner

## 2019-12-13 NOTE — Telephone Encounter (Signed)
Fax from [place] notice of  Prescription drug coverage for Solifenacin  Succinate Tablet. Type of coverage approved [facility] Approval dates from 07/13/2019-07/11/2020. Response not needed.

## 2019-12-17 DIAGNOSIS — M720 Palmar fascial fibromatosis [Dupuytren]: Secondary | ICD-10-CM | POA: Diagnosis not present

## 2019-12-17 DIAGNOSIS — M79641 Pain in right hand: Secondary | ICD-10-CM | POA: Diagnosis not present

## 2019-12-17 DIAGNOSIS — M79642 Pain in left hand: Secondary | ICD-10-CM | POA: Diagnosis not present

## 2019-12-26 ENCOUNTER — Telehealth: Payer: Self-pay

## 2019-12-26 NOTE — Telephone Encounter (Signed)
Patient scheduled for a virtual visit 12/27/19 at 8:30.

## 2019-12-26 NOTE — Telephone Encounter (Signed)
Patient states she is having back spasms and would like to be seen. This would normally be an in office visit, but since you have no availability today and are not in the office tomorrow, and Dr. Renato Gails is full tomorrow, Chrae said to ask if you wanted to do as a virtual visit tomorrow at [facility].

## 2019-12-26 NOTE — Telephone Encounter (Signed)
We can offer a virtual visit for tomorrow or if she sees a specialist she can call them to see if they can see her in person.

## 2019-12-27 ENCOUNTER — Telehealth: Payer: Self-pay

## 2019-12-27 ENCOUNTER — Other Ambulatory Visit: Payer: Self-pay

## 2019-12-27 ENCOUNTER — Telehealth (INDEPENDENT_AMBULATORY_CARE_PROVIDER_SITE_OTHER): Payer: Medicare Other | Admitting: Nurse Practitioner

## 2019-12-27 DIAGNOSIS — M62830 Muscle spasm of back: Secondary | ICD-10-CM

## 2019-12-27 MED ORDER — PREDNISONE 10 MG (21) PO TBPK: ORAL_TABLET | ORAL | 0 refills | Status: DC

## 2019-12-27 MED ORDER — CYCLOBENZAPRINE HCL 5 MG PO TABS: 5.0000 mg | ORAL_TABLET | Freq: Three times a day (TID) | ORAL | 0 refills | Status: DC | PRN

## 2019-12-27 NOTE — Progress Notes (Signed)
This service is provided via telemedicine  No vital signs collected/recorded due to the encounter was a telemedicine visit.   Location of patient (ex: home, work):  Home  Patient consents to a telephone visit:  Yes, see telephone encounter dated 12/27/2019  Location of the provider (ex: office, home):  [facility]  Name of any referring provider:  N/A  Names of all persons participating in the telemedicine service and their role in the encounter:  [name], Nurse Practitioner, Elveria Royals, CMA, and patient.   Time spent on call:  5 minutes with medical assistant    Careteam: Patient Care Team: Sharon Seller, NP as PCP - General (Geriatric Medicine) Serena Colonel, MD as Consulting Physician (Otolaryngology)   Advanced Directive information    No Known Allergies  Chief Complaint  Patient presents with  . Acute Visit    Back spasms.     HPI: Patient is a 74 y.o. female via telephone visit.  Unable to connect   Muscle on right side where bra line is. Has had this before.  Has done hot shower and stayed in the pool to give her relief.  Last time this happened it took awhile to get the spasms to stop.  "miserable" grabbing pain.  Has not had for the last 3 years.  Reports her husband is going to have hernia surgery and she has been having to lift more and thinks she has aggravated the muscle. No known injury.  Going on for 5 days. interfering with life due to pain.  Starting taking extra strength asa, muscle continues to be tight and tense.  Used flexeril in the past that has been helpful.   Review of Systems:  Review of Systems  Constitutional: Negative for chills and fever.  Musculoskeletal: Positive for back pain and myalgias. Negative for falls and joint pain.       Muscle spams  Neurological: Negative for tingling, focal weakness and weakness.    Past Medical History:  Diagnosis Date  . Hearing loss   . High cholesterol   .  Hypersomnia    Per Records from [facility]   . Nocturia    Per Records from [facility]   . Snoring    Per Records from [facility]   . Thyroid disease    Past Surgical History:  Procedure Laterality Date  . CATARACT EXTRACTION, BILATERAL  07/12/2014  . CESAREAN SECTION     AGE 85  . COLONOSCOPY     10 YEARS AGO AS OF 2018    Social History:   reports that she has quit smoking. Her smoking use included cigarettes. She has a 3.00 pack-year smoking history. She has never used smokeless tobacco. She reports current alcohol use. She reports that she does not use drugs.  Family History  Problem Relation Age of Onset  . Osteoporosis Mother   . Heart disease Father   . Cancer Father   . Heart failure Father   . Breast cancer Neg Hx     Medications: Patient's Medications  New Prescriptions   No medications on file  Previous Medications   ATORVASTATIN (LIPITOR) 20 MG TABLET    TAKE 1 TABLET BY MOUTH EVERY DAY   CALCIUM CARB-CHOLECALCIFEROL (CALCIUM 1000 + D PO)    Take 1 tablet by mouth daily.   CALCIUM CARBONATE (TUMS - DOSED IN MG ELEMENTAL CALCIUM) 500 MG CHEWABLE TABLET    Chew 1 tablet by mouth daily.  CHOLECALCIFEROL (VITAMIN D) 1000 UNITS TABLET    Take 1,000 Units by mouth daily. D3   FLUTICASONE (FLONASE) 50 MCG/ACT NASAL SPRAY    PLACE 1 SPRAYS INTO BOTH NOSTRILS 2 (TWO) TIMES DAILY.   LEVOTHYROXINE (SYNTHROID) 75 MCG TABLET    TAKE 1 TABLET BY MOUTH EVERY DAY BEFORE BREAKFAST   NAPROXEN SODIUM (ANAPROX) 220 MG TABLET    Take 220 mg by mouth as needed.   POLYETHYL GLYCOL-PROPYL GLYCOL (SYSTANE FREE OP)    Apply 1-2 drops to eye 2 (two) times daily.   SOLIFENACIN (VESICARE) 5 MG TABLET    Take 1 tablet (5 mg total) by mouth daily.  Modified Medications   No medications on file  Discontinued Medications   No medications on file    Physical Exam:  There were no vitals filed for this visit. There is no height or weight on file to calculate  BMI. Wt Readings from Last 3 Encounters:  11/26/19 203 lb 9.6 oz (92.4 kg)  05/21/19 206 lb 3.2 oz (93.5 kg)  04/27/19 203 lb (92.1 kg)    Labs reviewed: Basic Metabolic Panel: Recent Labs    04/23/19 0823 11/22/19 0801  NA 140 142  K 4.4 4.8  CL 105 108  CO2 28 27  GLUCOSE 94 93  BUN 19 18  CREATININE 0.96* 0.90  CALCIUM 9.2 9.0  TSH 2.92  --    Liver Function Tests: Recent Labs    04/23/19 0823 11/22/19 0801  AST 19 21  ALT 17 19  BILITOT 0.6 0.5  PROT 6.5 6.6   No results for input(s): LIPASE, AMYLASE in the last 8760 hours. No results for input(s): AMMONIA in the last 8760 hours. CBC: Recent Labs    04/23/19 0823  WBC 6.4  NEUTROABS 4,525  HGB 13.5  HCT 39.9  MCV 93.4  PLT 297   Lipid Panel: Recent Labs    04/23/19 0823 11/22/19 0801  CHOL 191 177  HDL 57 55  LDLCALC 117* 104*  TRIG 76 89  CHOLHDL 3.4 3.2   TSH: Recent Labs    04/23/19 0823  TSH 2.92   A1C: No results found for: HGBA1C   Assessment/Plan 1. Muscle spasm of back -continue heat/cold three times daily; can use muscle rub.  -no heavy lifting - predniSONE (STERAPRED UNI-PAK 21 TAB) 10 MG (21) TBPK tablet; Use as directed  Dispense: 21 tablet; Refill: 0 - cyclobenzaprine (FLEXERIL) 5 MG tablet; Take 1 tablet (5 mg total) by mouth 3 (three) times daily as needed for muscle spasms.  Dispense: 90 tablet; Refill: 0 -to notify if pain/spasms worsen or fail to improved.   Carlos American. Harle Battiest  [facility] [phone]    Virtual Visit via Telephone Note  I connected with@ on 12/27/19 at  8:30 AM EDT by telephone and verified that I am speaking with the correct person using two identifiers.  Location: Patient: home Provider: [facility] [place]   I discussed the limitations, risks, security and privacy concerns of performing an evaluation and management service by telephone and the availability of in person appointments. I also discussed with  the patient that there may be a patient responsible charge related to this service. The patient expressed understanding and agreed to proceed.   I discussed the assessment and treatment plan with the patient. The patient was provided an opportunity to ask questions and all were answered. The patient agreed with the plan and demonstrated an understanding of the instructions.  The patient was advised to call back or seek an in-person evaluation if the symptoms worsen or if the condition fails to improve as anticipated.  I provided  15 minutes of non-face-to-face time during this encounter.  Janene Harvey. [address] printed and mailed

## 2019-12-27 NOTE — Telephone Encounter (Signed)
[name], Ann Hamilton are scheduled for a virtual visit with your provider today.    Just as we do with appointments in the office, we must obtain your consent to participate.  Your consent will be active for this visit and any virtual visit you may have with one of our providers in the next 365 days.    If you have a MyChart account, I can also send a copy of this consent to you electronically.  All virtual visits are billed to your insurance company just like a traditional visit in the office.  As this is a virtual visit, video technology does not allow for your provider to perform a traditional examination.  This may limit your provider's ability to fully assess your condition.  If your provider identifies any concerns that need to be evaluated in person or the need to arrange testing such as labs, EKG, etc, we will make arrangements to do so.    Although advances in technology are sophisticated, we cannot ensure that it will always work on either your end or our end.  If the connection with a video visit is poor, we may have to switch to a telephone visit.  With either a video or telephone visit, we are not always able to ensure that we have a secure connection.   I need to obtain your verbal consent now.   Are you willing to proceed with your visit today?   Ann Hamilton has provided verbal consent on 12/27/2019 for a virtual visit (video or telephone).   Elveria Royals, [facility] 12/27/2019  8:29 AM

## 2020-01-04 ENCOUNTER — Telehealth: Payer: Self-pay

## 2020-01-04 NOTE — Telephone Encounter (Addendum)
Prior authorization request received from [facility] for Cyclobenzaprine HCl 5 mg tablet  Approved from 07/13/2019-04/03/2020

## 2020-01-04 NOTE — Telephone Encounter (Deleted)
Prior authorization approved from 07/13/2019-04/03/2020

## 2020-01-07 ENCOUNTER — Other Ambulatory Visit: Payer: Self-pay | Admitting: Nurse Practitioner

## 2020-02-04 ENCOUNTER — Other Ambulatory Visit: Payer: Self-pay | Admitting: Nurse Practitioner

## 2020-02-11 ENCOUNTER — Other Ambulatory Visit: Payer: Self-pay | Admitting: Nurse Practitioner

## 2020-02-11 DIAGNOSIS — E782 Mixed hyperlipidemia: Secondary | ICD-10-CM

## 2020-02-27 DIAGNOSIS — Z20822 Contact with and (suspected) exposure to covid-19: Secondary | ICD-10-CM | POA: Diagnosis not present

## 2020-02-28 DIAGNOSIS — H00024 Hordeolum internum left upper eyelid: Secondary | ICD-10-CM | POA: Diagnosis not present

## 2020-02-29 ENCOUNTER — Other Ambulatory Visit: Payer: Self-pay | Admitting: Nurse Practitioner

## 2020-02-29 DIAGNOSIS — E034 Atrophy of thyroid (acquired): Secondary | ICD-10-CM

## 2020-03-06 DIAGNOSIS — H0014 Chalazion left upper eyelid: Secondary | ICD-10-CM | POA: Diagnosis not present

## 2020-04-10 ENCOUNTER — Ambulatory Visit (INDEPENDENT_AMBULATORY_CARE_PROVIDER_SITE_OTHER): Payer: Medicare Other | Admitting: *Deleted

## 2020-04-10 ENCOUNTER — Other Ambulatory Visit: Payer: Self-pay

## 2020-04-10 DIAGNOSIS — Z23 Encounter for immunization: Secondary | ICD-10-CM

## 2020-04-11 HISTORY — PX: EYE SURGERY: SHX253

## 2020-04-17 DIAGNOSIS — Z23 Encounter for immunization: Secondary | ICD-10-CM | POA: Diagnosis not present

## 2020-04-18 ENCOUNTER — Telehealth: Payer: Self-pay | Admitting: Pulmonary Disease

## 2020-04-18 NOTE — Telephone Encounter (Signed)
Spoke to patient, who stated that the humidification is not working on her current cpap machine. Patient would like an order for new machine to be sent to [facility].    Dr. Vassie Loll, please advise. Thanks

## 2020-04-18 NOTE — Telephone Encounter (Signed)
She was set up with CPAP in 2017 & would not be eligible for a new one yet. Please ask her to check with DME if they can replace humidifier

## 2020-04-21 NOTE — Telephone Encounter (Signed)
ATC pt, call would not connect x3. Will try back.

## 2020-04-22 NOTE — Telephone Encounter (Signed)
Called and spoke to pt. Informed her of the recs per Dr. Alva. Pt verbalized understanding and denied any further questions or concerns at this time.   

## 2020-05-20 ENCOUNTER — Encounter: Payer: Self-pay | Admitting: Nurse Practitioner

## 2020-05-20 ENCOUNTER — Telehealth: Payer: Self-pay | Admitting: Adult Health

## 2020-05-20 ENCOUNTER — Other Ambulatory Visit: Payer: Self-pay

## 2020-05-20 ENCOUNTER — Ambulatory Visit (INDEPENDENT_AMBULATORY_CARE_PROVIDER_SITE_OTHER): Payer: Medicare Other | Admitting: Nurse Practitioner

## 2020-05-20 DIAGNOSIS — Z1231 Encounter for screening mammogram for malignant neoplasm of breast: Secondary | ICD-10-CM | POA: Diagnosis not present

## 2020-05-20 DIAGNOSIS — E2839 Other primary ovarian failure: Secondary | ICD-10-CM | POA: Diagnosis not present

## 2020-05-20 DIAGNOSIS — Z0000 Encounter for general adult medical examination without abnormal findings: Secondary | ICD-10-CM | POA: Diagnosis not present

## 2020-05-20 NOTE — Progress Notes (Signed)
This service is provided via telemedicine  No vital signs collected/recorded due to the encounter was a telemedicine visit.   Location of patient (ex: home, work): Home  Patient consents to a telephone visit:  Yes, see encounter dated 12/27/2019  Location of the provider (ex: office, home):  [facility]  Name of any referring provider:  .N/A  Names of all persons participating in the telemedicine service and their role in the encounter:  [name], Nurse Practitioner, Elveria Royals, CMA, and patient.   Time spent on call:  7 minutes with medical assistant

## 2020-05-20 NOTE — Patient Instructions (Signed)
Ann Hamilton , Thank you for taking time to come for your Medicare Wellness Visit. I appreciate your ongoing commitment to your health goals. Please review the following plan we discussed and let me know if I can assist you in the future.   Screening recommendations/referrals: Colonoscopy recommend to complete cologuard Mammogram orders place, to call [facility] to schedule Bone Density orders paced to call [facility] to schedule Recommended yearly ophthalmology/optometry visit for glaucoma screening and checkup Recommended yearly dental visit for hygiene and checkup  Vaccinations: Influenza vaccine up to date Pneumococcal vaccine up to date Tdap vaccine RECOMMENDED to get at your local pharmacy Shingles vaccine up to date    Advanced directives: on file.   Conditions/risks identified: obesity and advance age  Next appointment: 1 year.    Preventive Care 44 Years and Older, Female Preventive care refers to lifestyle choices and visits with your health care provider that can promote health and wellness. What does preventive care include?  A yearly physical exam. This is also called an annual well check.  Dental exams once or twice a year.  Routine eye exams. Ask your health care provider how often you should have your eyes checked.  Personal lifestyle choices, including:  Daily care of your teeth and gums.  Regular physical activity.  Eating a healthy diet.  Avoiding tobacco and drug use.  Limiting alcohol use.  Practicing safe sex.  Taking low-dose aspirin every day.  Taking vitamin and mineral supplements as recommended by your health care provider. What happens during an annual well check? The services and screenings done by your health care provider during your annual well check will depend on your age, overall health, lifestyle risk factors, and family history of disease. Counseling  Your health care provider may ask you questions about  your:  Alcohol use.  Tobacco use.  Drug use.  Emotional well-being.  Home and relationship well-being.  Sexual activity.  Eating habits.  History of falls.  Memory and ability to understand (cognition).  Work and work Astronomer.  Reproductive health. Screening  You may have the following tests or measurements:  Height, weight, and BMI.  Blood pressure.  Lipid and cholesterol levels. These may be checked every 5 years, or more frequently if you are over 5 years old.  Skin check.  Lung cancer screening. You may have this screening every year starting at age 75 if you have a 30-pack-year history of smoking and currently smoke or have quit within the past 15 years.  Fecal occult blood test (FOBT) of the stool. You may have this test every year starting at age 43.  Flexible sigmoidoscopy or colonoscopy. You may have a sigmoidoscopy every 5 years or a colonoscopy every 10 years starting at age 65.  Hepatitis C blood test.  Hepatitis B blood test.  Sexually transmitted disease (STD) testing.  Diabetes screening. This is done by checking your blood sugar (glucose) after you have not eaten for a while (fasting). You may have this done every 1-3 years.  Bone density scan. This is done to screen for osteoporosis. You may have this done starting at age 75.  Mammogram. This may be done every 1-2 years. Talk to your health care provider about how often you should have regular mammograms. Talk with your health care provider about your test results, treatment options, and if necessary, the need for more tests. Vaccines  Your health care provider may recommend certain vaccines, such as:  Influenza vaccine. This is recommended every  year.  Tetanus, diphtheria, and acellular pertussis (Tdap, Td) vaccine. You may need a Td booster every 10 years.  Zoster vaccine. You may need this after age 10.  Pneumococcal 13-valent conjugate (PCV13) vaccine. One dose is recommended  after age 61.  Pneumococcal polysaccharide (PPSV23) vaccine. One dose is recommended after age 53. Talk to your health care provider about which screenings and vaccines you need and how often you need them. This information is not intended to replace advice given to you by your health care provider. Make sure you discuss any questions you have with your health care provider. Document Released: 07/25/2015 Document Revised: 03/17/2016 Document Reviewed: 04/29/2015 Elsevier Interactive Patient Education  2017 [place] Prevention in the Home Falls can cause injuries. They can happen to people of all ages. There are many things you can do to make your home safe and to help prevent falls. What can I do on the outside of my home?  Regularly fix the edges of walkways and driveways and fix any cracks.  Remove anything that might make you trip as you walk through a door, such as a raised step or threshold.  Trim any bushes or trees on the path to your home.  Use bright outdoor lighting.  Clear any walking paths of anything that might make someone trip, such as rocks or tools.  Regularly check to see if handrails are loose or broken. Make sure that both sides of any steps have handrails.  Any raised decks and porches should have guardrails on the edges.  Have any leaves, snow, or ice cleared regularly.  Use sand or salt on walking paths during winter.  Clean up any spills in your garage right away. This includes oil or grease spills. What can I do in the bathroom?  Use night lights.  Install grab bars by the toilet and in the tub and shower. Do not use towel bars as grab bars.  Use non-skid mats or decals in the tub or shower.  If you need to sit down in the shower, use a plastic, non-slip stool.  Keep the floor dry. Clean up any water that spills on the floor as soon as it happens.  Remove soap buildup in the tub or shower regularly.  Attach bath mats securely with  double-sided non-slip rug tape.  Do not have throw rugs and other things on the floor that can make you trip. What can I do in the bedroom?  Use night lights.  Make sure that you have a light by your bed that is easy to reach.  Do not use any sheets or blankets that are too big for your bed. They should not hang down onto the floor.  Have a firm chair that has side arms. You can use this for support while you get dressed.  Do not have throw rugs and other things on the floor that can make you trip. What can I do in the kitchen?  Clean up any spills right away.  Avoid walking on wet floors.  Keep items that you use a lot in easy-to-reach places.  If you need to reach something above you, use a strong step stool that has a grab bar.  Keep electrical cords out of the way.  Do not use floor polish or wax that makes floors slippery. If you must use wax, use non-skid floor wax.  Do not have throw rugs and other things on the floor that can make you trip. What can  I do with my stairs?  Do not leave any items on the stairs.  Make sure that there are handrails on both sides of the stairs and use them. Fix handrails that are broken or loose. Make sure that handrails are as long as the stairways.  Check any carpeting to make sure that it is firmly attached to the stairs. Fix any carpet that is loose or worn.  Avoid having throw rugs at the top or bottom of the stairs. If you do have throw rugs, attach them to the floor with carpet tape.  Make sure that you have a light switch at the top of the stairs and the bottom of the stairs. If you do not have them, ask someone to add them for you. What else can I do to help prevent falls?  Wear shoes that:  Do not have high heels.  Have rubber bottoms.  Are comfortable and fit you well.  Are closed at the toe. Do not wear sandals.  If you use a stepladder:  Make sure that it is fully opened. Do not climb a closed stepladder.  Make  sure that both sides of the stepladder are locked into place.  Ask someone to hold it for you, if possible.  Clearly mark and make sure that you can see:  Any grab bars or handrails.  First and last steps.  Where the edge of each step is.  Use tools that help you move around (mobility aids) if they are needed. These include:  Canes.  Walkers.  Scooters.  Crutches.  Turn on the lights when you go into a dark area. Replace any light bulbs as soon as they burn out.  Set up your furniture so you have a clear path. Avoid moving your furniture around.  If any of your floors are uneven, fix them.  If there are any pets around you, be aware of where they are.  Review your medicines with your doctor. Some medicines can make you feel dizzy. This can increase your chance of falling. Ask your doctor what other things that you can do to help prevent falls. This information is not intended to replace advice given to you by your health care provider. Make sure you discuss any questions you have with your health care provider. Document Released: 04/24/2009 Document Revised: 12/04/2015 Document Reviewed: 08/02/2014 Elsevier Interactive Patient Education  2017 [employer].

## 2020-05-20 NOTE — Progress Notes (Signed)
Subjective:   Ann Hamilton is a 74 y.o. female who presents for Medicare Annual (Subsequent) preventive examination.  Review of Systems     Cardiac Risk Factors include: advanced age (>42men, >53 women);obesity (BMI >30kg/m2);dyslipidemia     Objective:    There were no vitals filed for this visit. There is no height or weight on file to calculate BMI.  Advanced Directives 05/20/2020 11/26/2019 05/18/2019 04/27/2019 10/26/2018 05/15/2018 10/27/2017  Does Patient Have a Medical Advance Directive? Yes Yes Yes No;Yes No Yes No  Type of Advance Directive Living will Living will - Healthcare Power of [place];Living will - Healthcare Power of [place];Living will -  Does patient want to make changes to medical advance directive? No - Patient declined No - Patient declined - No - Patient declined - No - Patient declined -  Copy of Healthcare Power of Attorney in Chart? - - - No - copy requested - No - copy requested -  Would patient like information on creating a medical advance directive? - - - - - - No - Patient declined    Current Medications (verified) Outpatient Encounter Medications as of 05/20/2020  Medication Sig  . atorvastatin (LIPITOR) 20 MG tablet TAKE 1 TABLET BY MOUTH EVERY DAY  . Calcium Carb-Cholecalciferol (CALCIUM 1000 + D PO) Take 1 tablet by mouth daily.  . calcium carbonate (TUMS - DOSED IN MG ELEMENTAL CALCIUM) 500 MG chewable tablet Chew 1 tablet by mouth daily.  . cholecalciferol (VITAMIN D) 1000 units tablet Take 1,000 Units by mouth daily. D3  . fluticasone (FLONASE) 50 MCG/ACT nasal spray PLACE 1 SPRAYS INTO BOTH NOSTRILS 2 (TWO) TIMES DAILY.  Marland Kitchen levothyroxine (SYNTHROID) 75 MCG tablet TAKE 1 TABLET BY MOUTH EVERY DAY BEFORE BREAKFAST  . naproxen sodium (ANAPROX) 220 MG tablet Take 220 mg by mouth as needed.  Marland Kitchen omeprazole (PRILOSEC) 20 MG capsule Take 20 mg by mouth daily. As needed  . Polyethyl Glycol-Propyl Glycol (SYSTANE FREE OP) Apply 1-2 drops to  eye 2 (two) times daily.  . solifenacin (VESICARE) 5 MG tablet TAKE 1 TABLET BY MOUTH EVERY DAY  . [DISCONTINUED] cyclobenzaprine (FLEXERIL) 5 MG tablet Take 1 tablet (5 mg total) by mouth 3 (three) times daily as needed for muscle spasms.  . [DISCONTINUED] predniSONE (STERAPRED UNI-PAK 21 TAB) 10 MG (21) TBPK tablet Use as directed   No facility-administered encounter medications on file as of 05/20/2020.    Allergies (verified) Patient has no known allergies.   History: Past Medical History:  Diagnosis Date  . Hearing loss   . High cholesterol   . Hypersomnia    Per Records from [facility]   . Nocturia    Per Records from [facility]   . Snoring    Per Records from [facility]   . Thyroid disease    Past Surgical History:  Procedure Laterality Date  . CATARACT EXTRACTION, BILATERAL  07/12/2014  . CESAREAN SECTION     AGE 65  . COLONOSCOPY     10 YEARS AGO AS OF 2018    Family History  Problem Relation Age of Onset  . Osteoporosis Mother   . Heart disease Father   . Cancer Father   . Heart failure Father   . Breast cancer Neg Hx    Social History   Socioeconomic History  . Marital status: Married    Spouse name: Not on file  . Number of children: Not on file  . Years of  education: Not on file  . Highest education level: Not on file  Occupational History  . Not on file  Tobacco Use  . Smoking status: Former Smoker    Packs/day: 1.00    Years: 3.00    Pack years: 3.00    Types: Cigarettes  . Smokeless tobacco: Never Used  . Tobacco comment: Quit 50 years ago as of 2019   Vaping Use  . Vaping Use: Never used  Substance and Sexual Activity  . Alcohol use: Yes    Comment: 2 drinks weekly   . Drug use: No  . Sexual activity: Not Currently  Other Topics Concern  . Not on file  Social History Narrative   Diet: No      Caffeine: Yes      Married, if yes what year: Yes, 1968      Do you live in a house, apartment, assisted  living, condo, trailer, ect: Condo, one stories, 2 persons       Pets: No       Current/Past profession: Charity fundraiser      Exercise: Yes, 3 times weekly, [employer]         Living Will: Yes   DNR: No   POA/HPOA: No      Questions below were not included in New Patient Packet      Functional Status:   Do you have difficulty bathing or dressing yourself?   Do you have difficulty preparing food or eating?   Do you have difficulty managing your medications?   Do you have difficulty managing your finances?   Do you have difficulty affording your medications?   Social Determinants of Health   Financial Resource Strain:   . Difficulty of Paying Living Expenses: Not on file  Food Insecurity:   . Worried About Programme researcher, broadcasting/film/video in the Last Year: Not on file  . Ran Out of Food in the Last Year: Not on file  Transportation Needs:   . Lack of Transportation (Medical): Not on file  . Lack of Transportation (Non-Medical): Not on file  Physical Activity:   . Days of Exercise per Week: Not on file  . Minutes of Exercise per Session: Not on file  Stress:   . Feeling of Stress : Not on file  Social Connections:   . Frequency of Communication with Friends and Family: Not on file  . Frequency of Social Gatherings with Friends and Family: Not on file  . Attends Religious Services: Not on file  . Active Member of Clubs or Organizations: Not on file  . Attends Banker Meetings: Not on file  . Marital Status: Not on file    Tobacco Counseling Counseling given: Not Answered Comment: Quit 50 years ago as of 2019    Clinical Intake:  Pre-visit preparation completed: Yes  Pain : No/denies pain     BMI - recorded: 31 Nutritional Status: BMI > 30  Obese Nutritional Risks: None Diabetes: No  How often do you need to have someone help you when you read instructions, pamphlets, or other written materials from your doctor or pharmacy?: 1 - Never  Diabetic?no          Activities of Daily Living In your present state of health, do you have any difficulty performing the following activities: 05/20/2020  Hearing? Y  Vision? N  Difficulty concentrating or making decisions? N  Walking or climbing stairs? N  Dressing or bathing? N  Doing errands, shopping? N  Preparing Food  and eating ? N  Using the Toilet? N  In the past six months, have you accidently leaked urine? N  Do you have problems with loss of bowel control? N  Managing your Medications? N  Managing your Finances? N  Housekeeping or managing your Housekeeping? N  Some recent data might be hidden    Patient Care Team: Sharon Seller, NP as PCP - General (Geriatric Medicine) Serena Colonel, MD as Consulting Physician (Otolaryngology)  Indicate any recent Medical Services you may have received from other than [facility] providers in the past year (date may be approximate).     Assessment:   This is a routine wellness examination for Skilynn.  Hearing/Vision screen  Hearing Screening   125Hz  250Hz  500Hz  1000Hz  2000Hz  3000Hz  4000Hz  6000Hz  8000Hz   Right ear:           Left ear:           Comments: Patient wears hearing aids  Vision Screening Comments: Patient states she had cataract surgery and she doesn't need glasses  Dietary issues and exercise activities discussed: Current Exercise Habits: Structured exercise class, Type of exercise: calisthenics;walking, Time (Minutes): 45, Frequency (Times/Week): 7, Weekly Exercise (Minutes/Week): 315  Goals    . Exercise 3x per week (30 min per time)     Patient will continue exercising 3 days a week.    . Weight (lb) < 167 lb (75.8 kg)      Depression Screen PHQ 2/9 Scores 05/20/2020 05/18/2019 05/15/2018 04/27/2018 03/30/2017  PHQ - 2 Score 0 0 0 0 0    Fall Risk Fall Risk  05/20/2020 11/26/2019 05/18/2019 04/27/2019 10/26/2018  Falls in the past year? 0 0 0 0 0  Number falls in past yr: 0 0 - 0 0  Injury with Fall? 0 0 - 0 0    Any stairs in  or around the home? Yes  If so, are there any without handrails? No  Home free of loose throw rugs in walkways, pet beds, electrical cords, etc? Yes  Adequate lighting in your home to reduce risk of falls? Yes   ASSISTIVE DEVICES UTILIZED TO PREVENT FALLS:  Life alert? No  Use of a cane, walker or w/c? No  Grab bars in the bathroom? Yes  Shower chair or bench in shower? Yes  Elevated toilet seat or a handicapped toilet? Yes   TIMED UP AND GO:  Was the test performed? No .    Cognitive Function: MMSE - Mini Mental State Exam 05/15/2018 05/05/2017  Orientation to time 4 5  Orientation to Place 5 5  Registration 3 3  Attention/ Calculation 5 5  Recall 3 3  Language- name 2 objects 2 2  Language- repeat 1 1  Language- follow 3 step command 3 3  Language- read & follow direction 1 1  Write a sentence 1 1  Copy design 1 1  Total score 29 30     6CIT Screen 05/20/2020 05/18/2019  What Year? 0 points 0 points  What month? 0 points 0 points  What time? 0 points 0 points  Count back from 20 0 points 0 points  Months in reverse 0 points 0 points  Repeat phrase 0 points 0 points  Total Score 0 0    Immunizations Immunization History  Administered Date(s) Administered  . Fluad Quad(high Dose 65+) 04/27/2019, 04/10/2020  . Influenza, High Dose Seasonal PF 03/30/2017, 04/27/2018  . Influenza-Unspecified 04/11/2016  . PFIZER SARS-COV-2 Vaccination 08/17/2019, 09/11/2019, 04/17/2020  . Pneumococcal  Conjugate-13 06/21/2014  . Pneumococcal Polysaccharide-23 05/13/2010  . Td 07/12/2009  . Zoster Recombinat (Shingrix) 06/16/2019, 10/07/2019    TDAP status: Due, Education has been provided regarding the importance of this vaccine. Advised may receive this vaccine at local pharmacy or Health Dept. Aware to provide a copy of the vaccination record if obtained from local pharmacy or Health Dept. Verbalized acceptance and understanding. Flu Vaccine status: Up to date Pneumococcal  vaccine status: Up to date Covid-19 vaccine status: Completed vaccines  Qualifies for Shingles Vaccine? Yes   Zostavax completed No   Shingrix Completed?: Yes  Screening Tests Health Maintenance  Topic Date Due  . TETANUS/TDAP  07/13/2019  . Fecal DNA (Cologuard)  05/16/2020  . INFLUENZA VACCINE  Completed  . DEXA SCAN  Completed  . COVID-19 Vaccine  Completed  . Hepatitis C Screening  Completed  . PNA vac Low Risk Adult  Completed    Health Maintenance  Health Maintenance Due  Topic Date Due  . TETANUS/TDAP  07/13/2019  . Fecal DNA (Cologuard)  05/16/2020    Colorectal cancer screening: Completed 2018. Repeat every 3 years Mammogram status: Ordered today. Pt provided with contact info and advised to call to schedule appt.  Bone Density status: Ordered today. Pt provided with contact info and advised to call to schedule appt.  Lung Cancer Screening: (Low Dose CT Chest recommended if Age 21-80 years, 30 pack-year currently smoking OR have quit w/in 15years.) does not qualify.   Lung Cancer Screening Referral: na  Additional Screening:  Hepatitis C Screening: does qualify; Completed   Vision Screening: Recommended annual ophthalmology exams for early detection of glaucoma and other disorders of the eye. Is the patient up to date with their annual eye exam?  Yes  Who is the provider or what is the name of the office in which the patient attends annual eye exams? [facility] If pt is not established with a provider, would they like to be referred to a provider to establish care? No .   Dental Screening: Recommended annual dental exams for proper oral hygiene  Community Resource Referral / Chronic Care Management: CRR required this visit?  No   CCM required this visit?  No      Plan:     I have personally reviewed and noted the following in the patient's chart:   . Medical and social history . Use of alcohol, tobacco or illicit drugs  . Current medications and  supplements . Functional ability and status . Nutritional status . Physical activity . Advanced directives . List of other physicians . Hospitalizations, surgeries, and ER visits in previous 12 months . Vitals . Screenings to include cognitive, depression, and falls . Referrals and appointments  In addition, I have reviewed and discussed with patient certain preventive protocols, quality metrics, and best practice recommendations. A written personalized care plan for preventive services as well as general preventive health recommendations were provided to patient.     Sharon Seller, NP   05/20/2020    Virtual Visit via Telephone Note  I connected with@ on 05/20/20 at  9:00 AM EST by telephone and verified that I am speaking with the correct person using two identifiers.  Location: Patient: home Provider: [facility]   I discussed the limitations, risks, security and privacy concerns of performing an evaluation and management service by telephone and the availability of in person appointments. I also discussed with the patient that there may be a patient responsible charge related to this service.  The patient expressed understanding and agreed to proceed.   I discussed the assessment and treatment plan with the patient. The patient was provided an opportunity to ask questions and all were answered. The patient agreed with the plan and demonstrated an understanding of the instructions.   The patient was advised to call back or seek an in-person evaluation if the symptoms worsen or if the condition fails to improve as anticipated.  I provided 15 minutes of non-face-to-face time during this encounter.  Mesa Janus K. Janene HarveyBiagio BorgEubanks, AGNP Avs printed and mailed

## 2020-05-20 NOTE — Telephone Encounter (Signed)
Called and spoke with patient, she will come in at 10:30 instead of 9 am as previously scheduled.  She will do an in office visit.  Nothing further needed.

## 2020-05-21 ENCOUNTER — Encounter: Payer: Self-pay | Admitting: Adult Health

## 2020-05-21 ENCOUNTER — Ambulatory Visit (INDEPENDENT_AMBULATORY_CARE_PROVIDER_SITE_OTHER): Payer: Medicare Other | Admitting: Adult Health

## 2020-05-21 ENCOUNTER — Other Ambulatory Visit: Payer: Self-pay

## 2020-05-21 VITALS — BP 130/80 | HR 87 | Temp 97.3°F | Ht 63.0 in | Wt 187.0 lb

## 2020-05-21 DIAGNOSIS — G4733 Obstructive sleep apnea (adult) (pediatric): Secondary | ICD-10-CM | POA: Diagnosis not present

## 2020-05-21 DIAGNOSIS — E669 Obesity, unspecified: Secondary | ICD-10-CM | POA: Diagnosis not present

## 2020-05-21 DIAGNOSIS — Z9989 Dependence on other enabling machines and devices: Secondary | ICD-10-CM

## 2020-05-21 NOTE — Progress Notes (Signed)
@Patient  ID: , female    DOB: 05-05-1946, 74 y.o.   MRN: [id number]  Chief Complaint  Patient presents with  . Follow-up    Referring provider: [id number], NP  HPI: 74 year old female followed for obstructive sleep apnea on nocturnal CPAP  TEST/EVENTS :  2017 NPSG - AHI of 15/hour with lowest desaturation around 81%.  05/21/20 Follow up : OSA  Patient returns for a follow-up visit.  Patient has underlying sleep apnea is on nocturnal CPAP.  She says she is doing well she feels rested on CPAP.  She feels that she benefits from CPAP and has less daytime sleepiness.  CPAP download shows excellent compliance with 100% usage.  Daily average usage at 8 hours.  AHI is 0.8.  Patient CPAP pressure is 11 cmH20  Patient says she remains active.  No Known Allergies  Immunization History  Administered Date(s) Administered  . Fluad Quad(high Dose 65+) 04/27/2019, 04/10/2020  . Influenza, High Dose Seasonal PF 03/30/2017, 04/27/2018  . Influenza-Unspecified 04/11/2016  . PFIZER SARS-COV-2 Vaccination 08/17/2019, 09/11/2019, 04/17/2020  . Pneumococcal Conjugate-13 06/21/2014  . Pneumococcal Polysaccharide-23 05/13/2010  . Td 07/12/2009  . Zoster Recombinat (Shingrix) 06/16/2019, 10/07/2019    Past Medical History:  Diagnosis Date  . Hearing loss   . High cholesterol   . Hypersomnia    Per Records from [facility]   . Nocturia    Per Records from [facility]   . Snoring    Per Records from [facility]   . Thyroid disease     Tobacco History: Social History   Tobacco Use  Smoking Status Former Smoker  . Packs/day: 1.00  . Years: 3.00  . Pack years: 3.00  . Types: Cigarettes  Smokeless Tobacco Never Used  Tobacco Comment   Quit 50 years ago as of 2019    Counseling given: Not Answered Comment: Quit 50 years ago as of 2019    Outpatient Medications Prior to Visit  Medication Sig Dispense Refill  . atorvastatin  (LIPITOR) 20 MG tablet TAKE 1 TABLET BY MOUTH EVERY DAY 90 tablet 1  . Calcium Carb-Cholecalciferol (CALCIUM 1000 + D PO) Take 1 tablet by mouth daily.    . calcium carbonate (TUMS - DOSED IN MG ELEMENTAL CALCIUM) 500 MG chewable tablet Chew 1 tablet by mouth daily.    . cholecalciferol (VITAMIN D) 1000 units tablet Take 1,000 Units by mouth daily. D3    . fluticasone (FLONASE) 50 MCG/ACT nasal spray PLACE 1 SPRAYS INTO BOTH NOSTRILS 2 (TWO) TIMES DAILY. 48 g 2  . levothyroxine (SYNTHROID) 75 MCG tablet TAKE 1 TABLET BY MOUTH EVERY DAY BEFORE BREAKFAST 90 tablet 1  . naproxen sodium (ANAPROX) 220 MG tablet Take 220 mg by mouth as needed.    2020 omeprazole (PRILOSEC) 20 MG capsule Take 20 mg by mouth daily. As needed    . Polyethyl Glycol-Propyl Glycol (SYSTANE FREE OP) Apply 1-2 drops to eye 2 (two) times daily.    . solifenacin (VESICARE) 5 MG tablet TAKE 1 TABLET BY MOUTH EVERY DAY 30 tablet 5   No facility-administered medications prior to visit.     Review of Systems:   Constitutional:   No  weight loss, night sweats,  Fevers, chills, fatigue, or  lassitude.  HEENT:   No headaches,  Difficulty swallowing,  Tooth/dental problems, or  Sore throat,                No sneezing, itching, ear  ache, nasal congestion, post nasal drip,   CV:  No chest pain,  Orthopnea, PND, swelling in lower extremities, anasarca, dizziness, palpitations, syncope.   GI  No heartburn, indigestion, abdominal pain, nausea, vomiting, diarrhea, change in bowel habits, loss of appetite, bloody stools.   Resp: No shortness of breath with exertion or at rest.  No excess mucus, no productive cough,  No non-productive cough,  No coughing up of blood.  No change in color of mucus.  No wheezing.  No chest wall deformity  Skin: no rash or lesions.  GU: no dysuria, change in color of urine, no urgency or frequency.  No flank pain, no hematuria   MS:  No joint pain or swelling.  No decreased range of motion.  No back  pain.    Physical Exam  BP 130/80 (BP Location: Left Arm, Patient Position: Sitting, Cuff Size: Large)   Pulse 87   Temp (!) 97.3 F (36.3 C) (Temporal)   Ht 5\' 3"  (1.6 m)   Wt 187 lb (84.8 kg)   SpO2 97%   BMI 33.13 kg/m   GEN: A/Ox3; pleasant , NAD, well nourished    HEENT:  [place]/AT,    NOSE-clear, THROAT-clear, no lesions, no postnasal drip or exudate noted.   NECK:  Supple w/ fair ROM; no JVD; normal carotid impulses w/o bruits; no thyromegaly or nodules palpated; no lymphadenopathy.    RESP  Clear  P & A; w/o, wheezes/ rales/ or rhonchi. no accessory muscle use, no dullness to percussion  CARD:  RRR, no m/r/g, no peripheral edema, pulses intact, no cyanosis or clubbing.  GI:   Soft & nt; nml bowel sounds; no organomegaly or masses detected.   Musco: Warm bil, no deformities or joint swelling noted.   Neuro: alert, no focal deficits noted.    Skin: Warm, no lesions or rashes    Lab Results:  CBC    BNP No results found for: BNP  ProBNP No results found for: PROBNP  Imaging: No results found.    No flowsheet data found.  No results found for: NITRICOXIDE      Assessment & Plan:   No problem-specific Assessment & Plan notes found for this encounter.     , NP 05/21/2020

## 2020-05-21 NOTE — Patient Instructions (Signed)
Keep up the good work Continue on CPAP at bedtime Work on healthy weight Remain active Do not drive if sleepy Follow-up in 1 year with Dr. Vassie Loll and as needed

## 2020-05-22 NOTE — Assessment & Plan Note (Signed)
Excellent control on nocturnal CPAP  Plan  Patient Instructions  Keep up the good work Continue on CPAP at bedtime Work on healthy weight Remain active Do not drive if sleepy Follow-up in 1 year with Dr. Vassie Loll and as needed

## 2020-05-22 NOTE — Assessment & Plan Note (Signed)
Healthy weight loss discussed 

## 2020-05-26 ENCOUNTER — Other Ambulatory Visit: Payer: Self-pay

## 2020-05-26 ENCOUNTER — Other Ambulatory Visit: Payer: Medicare Other

## 2020-05-26 DIAGNOSIS — E034 Atrophy of thyroid (acquired): Secondary | ICD-10-CM

## 2020-05-26 DIAGNOSIS — E785 Hyperlipidemia, unspecified: Secondary | ICD-10-CM

## 2020-05-27 LAB — COMPLETE METABOLIC PANEL WITH GFR
AG Ratio: 1.4 (calc) (ref 1.0–2.5)
ALT: 21 U/L (ref 6–29)
AST: 28 U/L (ref 10–35)
Albumin: 3.9 g/dL (ref 3.6–5.1)
Alkaline phosphatase (APISO): 51 U/L (ref 37–153)
BUN: 21 mg/dL (ref 7–25)
CO2: 28 mmol/L (ref 20–32)
Calcium: 9.2 mg/dL (ref 8.6–10.4)
Chloride: 105 mmol/L (ref 98–110)
Creat: 0.82 mg/dL (ref 0.60–0.93)
GFR, Est African American: 81 mL/min/{1.73_m2} (ref >=60)
GFR, Est Non African American: 70 mL/min/{1.73_m2} (ref >=60)
Globulin: 2.8 g/dL (calc) (ref 1.9–3.7)
Glucose, Bld: 86 mg/dL (ref 65–99)
Potassium: 4.8 mmol/L (ref 3.5–5.3)
Sodium: 141 mmol/L (ref 135–146)
Total Bilirubin: 0.4 mg/dL (ref 0.2–1.2)
Total Protein: 6.7 g/dL (ref 6.1–8.1)

## 2020-05-27 LAB — CBC WITH DIFFERENTIAL/PLATELET
Absolute Monocytes: 555 cells/uL (ref 200–950)
Basophils Absolute: 67 cells/uL (ref 0–200)
Basophils Relative: 1.1 %
Eosinophils Absolute: 207 cells/uL (ref 15–500)
Eosinophils Relative: 3.4 %
HCT: 43.8 % (ref 35.0–45.0)
Hemoglobin: 14.8 g/dL (ref 11.7–15.5)
Lymphs Abs: 1037 cells/uL (ref 850–3900)
MCH: 32.7 pg (ref 27.0–33.0)
MCHC: 33.8 g/dL (ref 32.0–36.0)
MCV: 96.7 fL (ref 80.0–100.0)
MPV: 12.5 fL (ref 7.5–12.5)
Monocytes Relative: 9.1 %
Neutro Abs: 4233 cells/uL (ref 1500–7800)
Neutrophils Relative %: 69.4 %
Platelets: 237 10*3/uL (ref 140–400)
RBC: 4.53 10*6/uL (ref 3.80–5.10)
RDW: 12.1 % (ref 11.0–15.0)
Total Lymphocyte: 17 %
WBC: 6.1 10*3/uL (ref 3.8–10.8)

## 2020-05-27 LAB — LIPID PANEL
Cholesterol: 179 mg/dL (ref <200)
HDL: 51 mg/dL (ref >=50)
LDL Cholesterol (Calc): 112 mg/dL (calc) — ABNORMAL HIGH
Non-HDL Cholesterol (Calc): 128 mg/dL (calc) (ref <130)
Total CHOL/HDL Ratio: 3.5 (calc) (ref <5.0)
Triglycerides: 71 mg/dL (ref <150)

## 2020-05-27 LAB — TSH: TSH: 2.2 mIU/L (ref 0.40–4.50)

## 2020-05-28 ENCOUNTER — Ambulatory Visit (INDEPENDENT_AMBULATORY_CARE_PROVIDER_SITE_OTHER): Payer: Medicare Other | Admitting: Nurse Practitioner

## 2020-05-28 ENCOUNTER — Other Ambulatory Visit: Payer: Self-pay

## 2020-05-28 ENCOUNTER — Encounter: Payer: Self-pay | Admitting: Nurse Practitioner

## 2020-05-28 VITALS — BP 128/70 | HR 93 | Temp 97.3°F | Ht 63.0 in | Wt 185.0 lb

## 2020-05-28 DIAGNOSIS — M1712 Unilateral primary osteoarthritis, left knee: Secondary | ICD-10-CM

## 2020-05-28 DIAGNOSIS — E782 Mixed hyperlipidemia: Secondary | ICD-10-CM

## 2020-05-28 DIAGNOSIS — M8580 Other specified disorders of bone density and structure, unspecified site: Secondary | ICD-10-CM

## 2020-05-28 DIAGNOSIS — E034 Atrophy of thyroid (acquired): Secondary | ICD-10-CM

## 2020-05-28 DIAGNOSIS — E669 Obesity, unspecified: Secondary | ICD-10-CM | POA: Diagnosis not present

## 2020-05-28 DIAGNOSIS — G4733 Obstructive sleep apnea (adult) (pediatric): Secondary | ICD-10-CM | POA: Diagnosis not present

## 2020-05-28 DIAGNOSIS — Z9989 Dependence on other enabling machines and devices: Secondary | ICD-10-CM

## 2020-05-28 NOTE — Progress Notes (Signed)
Careteam: Patient Care Team: Sharon Seller, NP as PCP - General (Geriatric Medicine) Serena Colonel, MD as Consulting Physician (Otolaryngology)  PLACE OF SERVICE:  [facility] CLINIC  Advanced Directive information Does Patient Have a Medical Advance Directive?: Yes, Type of Advance Directive: Living will, Does patient want to make changes to medical advance directive?: No - Patient declined  No Known Allergies  Chief Complaint  Patient presents with  . Medical Management of Chronic Issues    Routine 6 month follow-up and discuss labs (copy printed). Plans to get TDap/TD vaccine at [facility] today.      HPI: Patient is a 74 y.o. female for routine follow up.  Continues to be a part of a move study. She is more aware of what she is doing. She has lost 18 lbs since last OV with increase in activity and dietary changes. Uses myfitness pal  Back pain/muscle spasm resolved after treatment.   Seasonal allergies- controlled on flonase- uses PRN  Hyperlipidemia- stable on lipitor and dietary modifications  OAB- vesicare has helped a lot.   GERD- not an issues, uses omeprazole 20 mg only PRN. Has  head of bed elevated   Review of Systems:  Review of Systems  Constitutional: Negative for chills, fever and weight loss.  HENT: Negative for tinnitus.   Respiratory: Negative for cough, sputum production and shortness of breath.   Cardiovascular: Negative for chest pain, palpitations and leg swelling.  Gastrointestinal: Negative for abdominal pain, constipation, diarrhea and heartburn.  Genitourinary: Negative for dysuria, frequency and urgency.  Musculoskeletal: Negative for back pain, falls, joint pain and myalgias.  Skin: Negative.   Neurological: Negative for dizziness and headaches.  Psychiatric/Behavioral: Negative for depression and memory loss. The patient does not have insomnia.     Past Medical History:  Diagnosis Date  . Hearing loss   . High cholesterol   . Hypersomnia     Per Records from [facility] Pulmonary   . Nocturia    Per Records from [facility] Pulmonary   . Snoring    [facility]   . Thyroid disease    Past Surgical History:  Procedure Laterality Date  . CATARACT EXTRACTION, BILATERAL  07/12/2014  . CESAREAN SECTION     AGE 66  . COLONOSCOPY     10 YEARS AGO AS OF 2018   . EYE SURGERY  04/2020   Social History:   reports that she has quit smoking. Her smoking use included cigarettes. She has a 3.00 pack-year smoking history. She has never used smokeless tobacco. She reports current alcohol use. She reports that she does not use drugs.  Family History  Problem Relation Age of Onset  . Osteoporosis Mother   . Heart disease Father   . Cancer Father   . Heart failure Father   . Breast cancer Neg Hx     Medications: Patient's Medications  New Prescriptions   No medications on file  Previous Medications   ATORVASTATIN (LIPITOR) 20 MG TABLET    TAKE 1 TABLET BY MOUTH EVERY DAY   CALCIUM CARB-CHOLECALCIFEROL (CALCIUM 1000 + D PO)    Take 1 tablet by mouth daily.   CALCIUM CARBONATE (TUMS - DOSED IN MG ELEMENTAL CALCIUM) 500 MG CHEWABLE TABLET    Chew 1 tablet by mouth daily.   CHOLECALCIFEROL (VITAMIN D) 1000 UNITS TABLET    Take 1,000 Units by mouth daily. D3   FLUTICASONE (FLONASE) 50 MCG/ACT NASAL SPRAY  PLACE 1 SPRAYS INTO BOTH NOSTRILS 2 (TWO) TIMES DAILY.   LEVOTHYROXINE (SYNTHROID) 75 MCG TABLET    TAKE 1 TABLET BY MOUTH EVERY DAY BEFORE BREAKFAST   NAPROXEN SODIUM (ANAPROX) 220 MG TABLET    Take 220 mg by mouth as needed.   OMEPRAZOLE (PRILOSEC) 20 MG CAPSULE    Take 20 mg by mouth daily. As needed   POLYETHYL GLYCOL-PROPYL GLYCOL (SYSTANE FREE OP)    Apply 1-2 drops to eye 2 (two) times daily.   SOLIFENACIN (VESICARE) 5 MG TABLET    TAKE 1 TABLET BY MOUTH EVERY DAY  Modified Medications   No medications on file  Discontinued Medications   No medications on file    Physical Exam:  Vitals:    05/28/20 1409  BP: 128/70  Pulse: 93  Temp: (!) 97.3 F (36.3 C)  TempSrc: Temporal  SpO2: 98%  Weight: 185 lb (83.9 kg)  Height: 5\' 3"  (1.6 m)   Body mass index is 32.77 kg/m. Wt Readings from Last 3 Encounters:  05/28/20 185 lb (83.9 kg)  05/21/20 187 lb (84.8 kg)  11/26/19 203 lb 9.6 oz (92.4 kg)    Physical Exam Constitutional:      General: She is not in acute distress.    Appearance: She is well-developed. She is not diaphoretic.  HENT:     Head: Normocephalic and atraumatic.     Mouth/Throat:     Pharynx: No oropharyngeal exudate.  Eyes:     Conjunctiva/sclera: Conjunctivae normal.     Pupils: Pupils are equal, round, and reactive to light.  Cardiovascular:     Rate and Rhythm: Normal rate and regular rhythm.     Heart sounds: Normal heart sounds.  Pulmonary:     Effort: Pulmonary effort is normal.     Breath sounds: Normal breath sounds.  Abdominal:     General: Bowel sounds are normal.     Palpations: Abdomen is soft.  Musculoskeletal:        General: No tenderness.     Cervical back: Normal range of motion and neck supple.  Skin:    General: Skin is warm and dry.  Neurological:     Mental Status: She is alert and oriented to person, place, and time.  Psychiatric:        Mood and Affect: Mood normal.        Behavior: Behavior normal.     Labs reviewed: Basic Metabolic Panel: Recent Labs    11/22/19 0801 05/26/20 0816  NA 142 141  K 4.8 4.8  CL 108 105  CO2 27 28  GLUCOSE 93 86  BUN 18 21  CREATININE 0.90 0.82  CALCIUM 9.0 9.2  TSH  --  2.20   Liver Function Tests: Recent Labs    11/22/19 0801 05/26/20 0816  AST 21 28  ALT 19 21  BILITOT 0.5 0.4  PROT 6.6 6.7   No results for input(s): LIPASE, AMYLASE in the last 8760 hours. No results for input(s): AMMONIA in the last 8760 hours. CBC: Recent Labs    05/26/20 0816  WBC 6.1  NEUTROABS 4,233  HGB 14.8  HCT 43.8  MCV 96.7  PLT 237   Lipid Panel: Recent Labs     11/22/19 0801 05/26/20 0816  CHOL 177 179  HDL 55 51  LDLCALC 104* 112*  TRIG 89 71  CHOLHDL 3.2 3.5   TSH: Recent Labs    05/26/20 0816  TSH 2.20   A1C: No results found for:  HGBA1C   Assessment/Plan 1. OSA on CPAP Continues on Cpap qhs  2. Hypothyroidism due to acquired atrophy of thyroid TSH at goal on synthroid 75 mcg  3. Osteoarthritis of left knee, unspecified osteoarthritis type -stable at this time, without worsening complaints of pain.   4. Osteopenia, unspecified location -continue cal and vit d with weight bearing exercise.   5. Mixed hyperlipidemia HDL and LDL at goal.continue lipitor with lifestyle modifications.   6. Obesity (BMI 30-39.9) Has lost 18 lbs since last in office visit! Congratulated on this. Continues to work towards weight loss goals with dietary modifications and increase in physical activity.  Next appt: 3 months on weight loss.  Janene Harvey. Biagio Borg  [facility] [phone]

## 2020-05-28 NOTE — Patient Instructions (Signed)
GREAT WORK ON THE WEIGHT LOSS Keep it up.  We will follow up in 3 months to see how you are doing.

## 2020-06-18 DIAGNOSIS — M720 Palmar fascial fibromatosis [Dupuytren]: Secondary | ICD-10-CM | POA: Diagnosis not present

## 2020-06-18 DIAGNOSIS — M79642 Pain in left hand: Secondary | ICD-10-CM | POA: Diagnosis not present

## 2020-06-18 DIAGNOSIS — M79641 Pain in right hand: Secondary | ICD-10-CM | POA: Diagnosis not present

## 2020-06-24 DIAGNOSIS — Z961 Presence of intraocular lens: Secondary | ICD-10-CM | POA: Diagnosis not present

## 2020-08-01 ENCOUNTER — Other Ambulatory Visit: Payer: Self-pay | Admitting: Nurse Practitioner

## 2020-08-16 ENCOUNTER — Other Ambulatory Visit: Payer: Self-pay | Admitting: Nurse Practitioner

## 2020-08-16 DIAGNOSIS — E782 Mixed hyperlipidemia: Secondary | ICD-10-CM

## 2020-08-19 ENCOUNTER — Telehealth: Payer: Self-pay

## 2020-08-19 NOTE — Telephone Encounter (Signed)
Called and left message for patient to call office. Called to se if she was interested in doing cologuard again since it has been three years since her last one.

## 2020-08-21 ENCOUNTER — Other Ambulatory Visit: Payer: Self-pay | Admitting: Nurse Practitioner

## 2020-08-21 DIAGNOSIS — E034 Atrophy of thyroid (acquired): Secondary | ICD-10-CM

## 2020-08-29 ENCOUNTER — Ambulatory Visit (INDEPENDENT_AMBULATORY_CARE_PROVIDER_SITE_OTHER): Payer: Medicare Other | Admitting: Nurse Practitioner

## 2020-08-29 ENCOUNTER — Other Ambulatory Visit: Payer: Self-pay

## 2020-08-29 ENCOUNTER — Encounter: Payer: Self-pay | Admitting: Nurse Practitioner

## 2020-08-29 VITALS — BP 130/90 | HR 95 | Temp 97.8°F | Ht 63.0 in | Wt 171.8 lb

## 2020-08-29 DIAGNOSIS — E782 Mixed hyperlipidemia: Secondary | ICD-10-CM | POA: Diagnosis not present

## 2020-08-29 DIAGNOSIS — Z9989 Dependence on other enabling machines and devices: Secondary | ICD-10-CM

## 2020-08-29 DIAGNOSIS — M1712 Unilateral primary osteoarthritis, left knee: Secondary | ICD-10-CM

## 2020-08-29 DIAGNOSIS — E669 Obesity, unspecified: Secondary | ICD-10-CM | POA: Diagnosis not present

## 2020-08-29 DIAGNOSIS — G4733 Obstructive sleep apnea (adult) (pediatric): Secondary | ICD-10-CM

## 2020-08-29 DIAGNOSIS — E034 Atrophy of thyroid (acquired): Secondary | ICD-10-CM | POA: Diagnosis not present

## 2020-08-29 NOTE — Progress Notes (Signed)
Careteam: Patient Care Team: Sharon Seller, NP as PCP - General (Geriatric Medicine) Serena Colonel, MD as Consulting Physician (Otolaryngology)  PLACE OF SERVICE:  [facility]  Advanced Directive information Does Patient Have a Medical Advance Directive?: Yes, Type of Advance Directive: Living will, Does patient want to make changes to medical advance directive?: No - Patient declined  No Known Allergies  Chief Complaint  Patient presents with  . Medical Management of Chronic Issues    3 month follow up. Discuss need for Tetanus/Tdap, Cologuard     HPI: Patient is a 75 y.o. female for routine follow up.  Weight management- involved in the move study at [facility]. Overall has done extremely well. Lost another 13lbs since last visit. Uses Myfitnesspal to manage her diet, eats low in carbs/sugar.   Back/knee pain- improved since she has lost weight. Now able to do Zumba classes three times a day.  Hyperlipidemia- stable on lipitor, dietary modifications using Myfitnesspal app  GERD- stable on omeprazole 20mg  as needed  OSA- uses CPAP at home, stable   Review of Systems:  Review of Systems  Constitutional: Negative for chills, fever and weight loss.  HENT: Negative for tinnitus.   Respiratory: Negative for cough, sputum production and shortness of breath.   Cardiovascular: Negative for chest pain, palpitations and leg swelling.  Gastrointestinal: Negative for abdominal pain, constipation, nausea and vomiting.  Genitourinary: Negative for dysuria, frequency and urgency.  Musculoskeletal: Positive for back pain (has improved) and joint pain (knee/hip pain- has improved). Negative for falls and myalgias.  Skin: Negative.   Neurological: Negative for dizziness, weakness and headaches.  Psychiatric/Behavioral: Negative for depression and memory loss. The patient does not have insomnia.     Past Medical History:  Diagnosis Date  . Hearing loss   . High cholesterol   .  Hypersomnia    Per Records from [facility] Pulmonary   . Nocturia    Per Records from [facility] Pulmonary   . Snoring    Per Records from [facility] Pulmonary   . Thyroid disease    Past Surgical History:  Procedure Laterality Date  . CATARACT EXTRACTION, BILATERAL  07/12/2014  . CESAREAN SECTION     AGE 87  . COLONOSCOPY     10 YEARS AGO AS OF 2018   . EYE SURGERY  04/2020   Social History:   reports that she has quit smoking. Her smoking use included cigarettes. She has a 3.00 pack-year smoking history. She has never used smokeless tobacco. She reports current alcohol use. She reports that she does not use drugs.  Family History  Problem Relation Age of Onset  . Osteoporosis Mother   . Heart disease Father   . Cancer Father   . Heart failure Father   . Breast cancer Neg Hx     Medications: Patient's Medications  New Prescriptions   No medications on file  Previous Medications   ATORVASTATIN (LIPITOR) 20 MG TABLET    TAKE 1 TABLET BY MOUTH EVERY DAY   CALCIUM CARB-CHOLECALCIFEROL (CALCIUM 1000 + D PO)    Take 1 tablet by mouth daily.   CALCIUM CARBONATE (TUMS - DOSED IN MG ELEMENTAL CALCIUM) 500 MG CHEWABLE TABLET    Chew 1 tablet by mouth daily.   CHOLECALCIFEROL (VITAMIN D) 1000 UNITS TABLET    Take 1,000 Units by mouth 2 (two) times daily. D3   FLUTICASONE (FLONASE) 50 MCG/ACT NASAL SPRAY    PLACE 1 SPRAYS INTO BOTH NOSTRILS  2 (TWO) TIMES DAILY.   LEVOTHYROXINE (SYNTHROID) 75 MCG TABLET    TAKE 1 TABLET BY MOUTH EVERY DAY BEFORE BREAKFAST   NAPROXEN SODIUM (ANAPROX) 220 MG TABLET    Take 220 mg by mouth as needed.   OMEPRAZOLE (PRILOSEC) 20 MG CAPSULE    Take 20 mg by mouth daily. As needed   POLYETHYL GLYCOL-PROPYL GLYCOL (SYSTANE FREE OP)    Apply 1-2 drops to eye 2 (two) times daily.   SOLIFENACIN (VESICARE) 5 MG TABLET    TAKE 1 TABLET BY MOUTH EVERY DAY  Modified Medications   No medications on file  Discontinued Medications   No medications on file     Physical Exam:  Vitals:   08/29/20 1456  BP: 130/90  Pulse: 95  Temp: 97.8 F (36.6 C)  TempSrc: Temporal  SpO2: 97%  Weight: 171 lb 12.8 oz (77.9 kg)  Height: 5\' 3"  (1.6 m)   Body mass index is 30.43 kg/m. Wt Readings from Last 3 Encounters:  08/29/20 171 lb 12.8 oz (77.9 kg)  05/28/20 185 lb (83.9 kg)  05/21/20 187 lb (84.8 kg)    Physical Exam Constitutional:      General: She is not in acute distress.    Appearance: Normal appearance. She is not diaphoretic.  HENT:     Head: Normocephalic and atraumatic.     Mouth/Throat:     Pharynx: No oropharyngeal exudate.  Eyes:     Conjunctiva/sclera: Conjunctivae normal.     Pupils: Pupils are equal, round, and reactive to light.  Cardiovascular:     Rate and Rhythm: Normal rate and regular rhythm.     Heart sounds: Normal heart sounds.  Pulmonary:     Effort: Pulmonary effort is normal.     Breath sounds: Normal breath sounds.  Abdominal:     General: Bowel sounds are normal.     Palpations: Abdomen is soft.  Musculoskeletal:        General: No swelling or tenderness. Normal range of motion.  Skin:    General: Skin is warm and dry.  Neurological:     Mental Status: She is alert and oriented to person, place, and time.     Labs reviewed: Basic Metabolic Panel: Recent Labs    11/22/19 0801 05/26/20 0816  NA 142 141  K 4.8 4.8  CL 108 105  CO2 27 28  GLUCOSE 93 86  BUN 18 21  CREATININE 0.90 0.82  CALCIUM 9.0 9.2  TSH  --  2.20   Liver Function Tests: Recent Labs    11/22/19 0801 05/26/20 0816  AST 21 28  ALT 19 21  BILITOT 0.5 0.4  PROT 6.6 6.7   No results for input(s): LIPASE, AMYLASE in the last 8760 hours. No results for input(s): AMMONIA in the last 8760 hours. CBC: Recent Labs    05/26/20 0816  WBC 6.1  NEUTROABS 4,233  HGB 14.8  HCT 43.8  MCV 96.7  PLT 237   Lipid Panel: Recent Labs    11/22/19 0801 05/26/20 0816  CHOL 177 179  HDL 55 51  LDLCALC 104* 112*  TRIG 89  71  CHOLHDL 3.2 3.5   TSH: Recent Labs    05/26/20 0816  TSH 2.20   A1C: No results found for: HGBA1C   Assessment/Plan 1. Osteoarthritis of left knee, unspecified osteoarthritis type Has knee and back pain, overall improved since weight loss. Will continue increasing physical activity, wearing good footwear, and strength training.  2. Mixed hyperlipidemia  LDL at goal, will continue on lipitor and lifestyle modifications  3. Obesity (BMI 30-39.9) Has lost an additional 13lbs. Continues to do very well. Continues to work toward her goal of living a healthier life.   4. OSA on CPAP Uses CPAP at bedtime, controlled at this time  5. Hypothyroidism due to acquired atrophy of thyroid TSH at goal on last labs, continue taking synthroid   Next appt: 3 months  Javohn Basey K. Biagio Borg  [facility] Medicine [phone]   I personally was present during the history, physical exam and medical decision-making activities of this service and have verified that the service and findings are accurately documented in the student's note

## 2020-08-29 NOTE — Patient Instructions (Signed)
Tdap at your local pharmacy  Keep up the great work   Follow up in 3 months with labs prior to visit.

## 2020-11-17 DIAGNOSIS — H903 Sensorineural hearing loss, bilateral: Secondary | ICD-10-CM | POA: Diagnosis not present

## 2020-11-25 ENCOUNTER — Other Ambulatory Visit: Payer: Medicare Other

## 2020-11-28 ENCOUNTER — Ambulatory Visit: Payer: Medicare Other | Admitting: Nurse Practitioner

## 2020-12-09 ENCOUNTER — Other Ambulatory Visit: Payer: Medicare Other

## 2020-12-09 ENCOUNTER — Other Ambulatory Visit: Payer: Self-pay

## 2020-12-09 DIAGNOSIS — E782 Mixed hyperlipidemia: Secondary | ICD-10-CM | POA: Diagnosis not present

## 2020-12-10 LAB — COMPLETE METABOLIC PANEL WITH GFR
AG Ratio: 1.5 (calc) (ref 1.0–2.5)
ALT: 20 U/L (ref 6–29)
AST: 22 U/L (ref 10–35)
Albumin: 4 g/dL (ref 3.6–5.1)
Alkaline phosphatase (APISO): 62 U/L (ref 37–153)
BUN: 24 mg/dL (ref 7–25)
CO2: 30 mmol/L (ref 20–32)
Calcium: 9.4 mg/dL (ref 8.6–10.4)
Chloride: 106 mmol/L (ref 98–110)
Creat: 0.88 mg/dL (ref 0.60–0.93)
GFR, Est African American: 74 mL/min/{1.73_m2} (ref >=60)
GFR, Est Non African American: 64 mL/min/{1.73_m2} (ref >=60)
Globulin: 2.7 g/dL (calc) (ref 1.9–3.7)
Glucose, Bld: 89 mg/dL (ref 65–99)
Potassium: 5 mmol/L (ref 3.5–5.3)
Sodium: 142 mmol/L (ref 135–146)
Total Bilirubin: 0.5 mg/dL (ref 0.2–1.2)
Total Protein: 6.7 g/dL (ref 6.1–8.1)

## 2020-12-10 LAB — LIPID PANEL
Cholesterol: 200 mg/dL — ABNORMAL HIGH (ref <200)
HDL: 55 mg/dL (ref >=50)
LDL Cholesterol (Calc): 125 mg/dL (calc) — ABNORMAL HIGH
Non-HDL Cholesterol (Calc): 145 mg/dL (calc) — ABNORMAL HIGH (ref <130)
Total CHOL/HDL Ratio: 3.6 (calc) (ref <5.0)
Triglycerides: 94 mg/dL (ref <150)

## 2020-12-12 ENCOUNTER — Encounter: Payer: Self-pay | Admitting: Nurse Practitioner

## 2020-12-12 ENCOUNTER — Ambulatory Visit (INDEPENDENT_AMBULATORY_CARE_PROVIDER_SITE_OTHER): Payer: Medicare Other | Admitting: Nurse Practitioner

## 2020-12-12 ENCOUNTER — Other Ambulatory Visit: Payer: Self-pay

## 2020-12-12 VITALS — BP 126/80 | HR 89 | Temp 97.9°F | Ht 63.0 in | Wt 165.8 lb

## 2020-12-12 DIAGNOSIS — J302 Other seasonal allergic rhinitis: Secondary | ICD-10-CM

## 2020-12-12 DIAGNOSIS — H9113 Presbycusis, bilateral: Secondary | ICD-10-CM

## 2020-12-12 DIAGNOSIS — E782 Mixed hyperlipidemia: Secondary | ICD-10-CM

## 2020-12-12 DIAGNOSIS — M720 Palmar fascial fibromatosis [Dupuytren]: Secondary | ICD-10-CM

## 2020-12-12 DIAGNOSIS — E663 Overweight: Secondary | ICD-10-CM

## 2020-12-12 DIAGNOSIS — E034 Atrophy of thyroid (acquired): Secondary | ICD-10-CM | POA: Diagnosis not present

## 2020-12-12 DIAGNOSIS — M1712 Unilateral primary osteoarthritis, left knee: Secondary | ICD-10-CM

## 2020-12-12 MED ORDER — ATORVASTATIN CALCIUM 40 MG PO TABS: 40.0000 mg | ORAL_TABLET | Freq: Every day | ORAL | 3 refills | Status: DC

## 2020-12-12 NOTE — Patient Instructions (Signed)
To call [phone] to schedule bone density  

## 2020-12-12 NOTE — Progress Notes (Signed)
Careteam: Patient Care Team: Sharon Seller, NP as PCP - General (Geriatric Medicine) Serena Colonel, MD as Consulting Physician (Otolaryngology)  PLACE OF SERVICE:  [facility] CLINIC  Advanced Directive information Does Patient Have a Medical Advance Directive?: Yes, Type of Advance Directive: Living will, Does patient want to make changes to medical advance directive?: No - Patient declined  No Known Allergies  Chief Complaint  Patient presents with  . Medical Management of Chronic Issues    3 month follow-up. Discuss need for TD/tdap and cologuard. Patient c/o left leg issues      HPI: Patient is a 75 y.o. female for routine follow up  Weight management- lots of vacations and celebrations. She continues to weight herself weekly. When she making modifications to maintain.   Hyperlipidemia- continues on lipitor, reviewed labs with pt   GERD-controlled on omeprazole.  hypothyroid -TSH stable on synthroid 75 mcg  Seasonal allergies- have been bad this year. Had to use flonase and nettipot daily.  She was miserable. Kept her from exercising.   She went to a hand specialist who she did not prefer. They told her she would need to go to a different specialist if she wanted injections. She plans to do some research and would like to see someone in [place] she will let me know at her next visit who she would like to see.   Review of Systems:  Review of Systems  Constitutional: Negative for chills, fever and weight loss.  HENT: Negative for tinnitus.   Respiratory: Negative for cough, sputum production and shortness of breath.   Cardiovascular: Negative for chest pain, palpitations and leg swelling.  Gastrointestinal: Negative for abdominal pain, constipation, diarrhea and heartburn.  Genitourinary: Negative for dysuria, frequency and urgency.  Musculoskeletal: Negative for back pain, falls, joint pain and myalgias.  Skin: Negative.   Neurological: Negative for dizziness and  headaches.  Psychiatric/Behavioral: Negative for depression and memory loss. The patient does not have insomnia.     Past Medical History:  Diagnosis Date  . Hearing loss   . High cholesterol   . Hypersomnia    Per Records from [facility] Pulmonary   . Nocturia    Per Records from [facility] Pulmonary   . Snoring    Per Records from [facility] Pulmonary   . Thyroid disease    Past Surgical History:  Procedure Laterality Date  . CATARACT EXTRACTION, BILATERAL  07/12/2014  . CESAREAN SECTION     AGE 70  . COLONOSCOPY     10 YEARS AGO AS OF 2018   . EYE SURGERY  04/2020   Social History:   reports that she has quit smoking. Her smoking use included cigarettes. She has a 3.00 pack-year smoking history. She has never used smokeless tobacco. She reports current alcohol use. She reports that she does not use drugs.  Family History  Problem Relation Age of Onset  . Osteoporosis Mother   . Heart disease Father   . Cancer Father   . Heart failure Father   . Breast cancer Neg Hx     Medications: Patient's Medications  New Prescriptions   No medications on file  Previous Medications   ATORVASTATIN (LIPITOR) 20 MG TABLET    TAKE 1 TABLET BY MOUTH EVERY DAY   CALCIUM CARB-CHOLECALCIFEROL (CALCIUM 1000 + D PO)    Take 1 tablet by mouth daily.   CALCIUM CARBONATE (TUMS - DOSED IN MG ELEMENTAL CALCIUM) 500 MG CHEWABLE TABLET  Chew 1 tablet by mouth daily.   CHOLECALCIFEROL (VITAMIN D) 1000 UNITS TABLET    Take 1,000 Units by mouth 2 (two) times daily. D3   FLUTICASONE (FLONASE) 50 MCG/ACT NASAL SPRAY    Place 2 sprays into both nostrils as needed for allergies or rhinitis.   LEVOTHYROXINE (SYNTHROID) 75 MCG TABLET    TAKE 1 TABLET BY MOUTH EVERY DAY BEFORE BREAKFAST   NAPROXEN SODIUM (ANAPROX) 220 MG TABLET    Take 220 mg by mouth as needed.   OMEPRAZOLE (PRILOSEC) 20 MG CAPSULE    Take 20 mg by mouth daily. As needed   POLYETHYL GLYCOL-PROPYL GLYCOL (SYSTANE FREE OP)    Apply 1-2  drops to eye 2 (two) times daily.   SOLIFENACIN (VESICARE) 5 MG TABLET    TAKE 1 TABLET BY MOUTH EVERY DAY  Modified Medications   No medications on file  Discontinued Medications   FLUTICASONE (FLONASE) 50 MCG/ACT NASAL SPRAY    PLACE 1 SPRAYS INTO BOTH NOSTRILS 2 (TWO) TIMES DAILY.    Physical Exam:  Vitals:   12/12/20 1449  BP: 126/80  Pulse: 89  Temp: 97.9 F (36.6 C)  TempSrc: Temporal  SpO2: 99%  Weight: 165 lb 12.8 oz (75.2 kg)  Height: 5\' 3"  (1.6 m)   Body mass index is 29.37 kg/m. Wt Readings from Last 3 Encounters:  12/12/20 165 lb 12.8 oz (75.2 kg)  08/29/20 171 lb 12.8 oz (77.9 kg)  05/28/20 185 lb (83.9 kg)    Physical Exam Constitutional:      General: She is not in acute distress.    Appearance: She is well-developed. She is not diaphoretic.  HENT:     Head: Normocephalic and atraumatic.     Mouth/Throat:     Pharynx: No oropharyngeal exudate.  Eyes:     Conjunctiva/sclera: Conjunctivae normal.     Pupils: Pupils are equal, round, and reactive to light.  Cardiovascular:     Rate and Rhythm: Normal rate and regular rhythm.     Heart sounds: Normal heart sounds.  Pulmonary:     Effort: Pulmonary effort is normal.     Breath sounds: Normal breath sounds.  Abdominal:     General: Bowel sounds are normal.     Palpations: Abdomen is soft.  Musculoskeletal:        General: No tenderness.     Cervical back: Normal range of motion and neck supple.  Skin:    General: Skin is warm and dry.  Neurological:     Mental Status: She is alert and oriented to person, place, and time.     Labs reviewed: Basic Metabolic Panel: Recent Labs    05/26/20 0816 12/09/20 0840  NA 141 142  K 4.8 5.0  CL 105 106  CO2 28 30  GLUCOSE 86 89  BUN 21 24  CREATININE 0.82 0.88  CALCIUM 9.2 9.4  TSH 2.20  --    Liver Function Tests: Recent Labs    05/26/20 0816 12/09/20 0840  AST 28 22  ALT 21 20  BILITOT 0.4 0.5  PROT 6.7 6.7   No results for input(s):  LIPASE, AMYLASE in the last 8760 hours. No results for input(s): AMMONIA in the last 8760 hours. CBC: Recent Labs    05/26/20 0816  WBC 6.1  NEUTROABS 4,233  HGB 14.8  HCT 43.8  MCV 96.7  PLT 237   Lipid Panel: Recent Labs    05/26/20 0816 12/09/20 0840  CHOL 179 200*  HDL  51 55  LDLCALC 112* 125*  TRIG 71 94  CHOLHDL 3.5 3.6   TSH: Recent Labs    05/26/20 0816  TSH 2.20   A1C: No results found for: HGBA1C   Assessment/Plan 1. Mixed hyperlipidemia -LDL not at goal. She is eating well at this time. Will increase lipitor to 40 mg daily to get to goal. - atorvastatin (LIPITOR) 40 MG tablet; Take 1 tablet (40 mg total) by mouth daily.  Dispense: 90 tablet; Refill: 3 - CMP; Future - Lipid Panel; Future - CBC with Differential/Platelet; Future - TSH; Future  2. Osteoarthritis of left knee, unspecified osteoarthritis type Occasional flare, continues on tylenol and will use naproxen as needed - CBC with Differential/Platelet; Future  3. Hypothyroidism due to acquired atrophy of thyroid -TSH at goal in November, continues synthroid 75 mcg - TSH; Future  4. Dupuytren's contracture of both hands -ongoing, was not pleased with hand specialist that she saw. She will research who she wants to see in the further and notify us.  5. Seasonal allergies -had a hard time with allergies this season. Doing better at this time.  - CBC with Differential/Platelet; Future  6. Presbycusis of both ears Having a hard time hearing today. Getting new hearing aides soon!  7. Overweight She continues to lose weight, considered overweight vs obese based on the BMI scale. congratulated on her success.   Next appt: 6 months, labs prior Mersedes Alber K. Biagio Borg  [facility] Adult Medicine [phone]

## 2020-12-23 ENCOUNTER — Other Ambulatory Visit: Payer: Self-pay

## 2020-12-23 ENCOUNTER — Other Ambulatory Visit (HOSPITAL_BASED_OUTPATIENT_CLINIC_OR_DEPARTMENT_OTHER): Payer: Self-pay

## 2020-12-23 ENCOUNTER — Ambulatory Visit: Payer: Medicare Other | Attending: Internal Medicine

## 2020-12-23 DIAGNOSIS — Z23 Encounter for immunization: Secondary | ICD-10-CM

## 2020-12-23 MED ORDER — PFIZER-BIONT COVID-19 VAC-TRIS 30 MCG/0.3ML IM SUSP
INTRAMUSCULAR | 0 refills | Status: DC
  Filled 2020-12-23: qty 0.3, 1d supply, fill #0

## 2020-12-23 NOTE — Progress Notes (Signed)
   Covid-19 Vaccination Clinic  Name:  Ann Hamilton    MRN: [id number] DOB: 07/27/1945  12/23/2020  Ann Hamilton was observed post Covid-19 immunization for 15 minutes without incident. She was provided with Vaccine Information Sheet and instruction to access the V-Safe system.   Ann Hamilton was instructed to call 911 with any severe reactions post vaccine: Difficulty breathing  Swelling of face and throat  A fast heartbeat  A bad rash all over body  Dizziness and weakness   Immunizations Administered     Name Date Dose VIS Date Route   PFIZER Comrnaty(Gray TOP) Covid-19 Vaccine 12/23/2020 10:15 AM 0.3 mL 06/19/2020 Intramuscular   Manufacturer: [employer], [employer]   Lot: [id number]   NDC: [phone]

## 2020-12-25 DIAGNOSIS — Z1211 Encounter for screening for malignant neoplasm of colon: Secondary | ICD-10-CM | POA: Diagnosis not present

## 2020-12-25 DIAGNOSIS — Z1212 Encounter for screening for malignant neoplasm of rectum: Secondary | ICD-10-CM | POA: Diagnosis not present

## 2021-01-01 LAB — EXTERNAL GENERIC LAB PROCEDURE: COLOGUARD: NEGATIVE

## 2021-01-01 LAB — COLOGUARD: COLOGUARD: NEGATIVE

## 2021-01-21 ENCOUNTER — Other Ambulatory Visit: Payer: Self-pay | Admitting: Nurse Practitioner

## 2021-02-14 ENCOUNTER — Other Ambulatory Visit: Payer: Self-pay | Admitting: Nurse Practitioner

## 2021-02-14 DIAGNOSIS — E034 Atrophy of thyroid (acquired): Secondary | ICD-10-CM

## 2021-02-14 DIAGNOSIS — E782 Mixed hyperlipidemia: Secondary | ICD-10-CM

## 2021-03-23 DIAGNOSIS — Z23 Encounter for immunization: Secondary | ICD-10-CM | POA: Diagnosis not present

## 2021-03-26 DIAGNOSIS — Z20822 Contact with and (suspected) exposure to covid-19: Secondary | ICD-10-CM | POA: Diagnosis not present

## 2021-04-23 ENCOUNTER — Ambulatory Visit: Payer: Medicare Other | Attending: Internal Medicine

## 2021-04-23 ENCOUNTER — Other Ambulatory Visit (HOSPITAL_BASED_OUTPATIENT_CLINIC_OR_DEPARTMENT_OTHER): Payer: Self-pay

## 2021-04-23 DIAGNOSIS — Z23 Encounter for immunization: Secondary | ICD-10-CM

## 2021-04-23 MED ORDER — PFIZER COVID-19 VAC BIVALENT 30 MCG/0.3ML IM SUSP
INTRAMUSCULAR | 0 refills | Status: DC
  Filled 2021-04-23: qty 0.3, 1d supply, fill #0

## 2021-04-23 NOTE — Progress Notes (Signed)
   Covid-19 Vaccination Clinic  Name:  Radhika Dershem    MRN: [id number] DOB: Jan 25, 1946  04/23/2021  Ms. Gasparini was observed post Covid-19 immunization for 15 minutes without incident. She was provided with Vaccine Information Sheet and instruction to access the V-Safe system.   Ms. Bryden was instructed to call 911 with any severe reactions post vaccine: Difficulty breathing  Swelling of face and throat  A fast heartbeat  A bad rash all over body  Dizziness and weakness

## 2021-05-26 ENCOUNTER — Encounter: Payer: Medicare Other | Admitting: Nurse Practitioner

## 2021-05-28 ENCOUNTER — Ambulatory Visit (INDEPENDENT_AMBULATORY_CARE_PROVIDER_SITE_OTHER): Payer: Medicare Other | Admitting: Nurse Practitioner

## 2021-05-28 ENCOUNTER — Telehealth: Payer: Self-pay

## 2021-05-28 ENCOUNTER — Encounter: Payer: Self-pay | Admitting: Pulmonary Disease

## 2021-05-28 ENCOUNTER — Other Ambulatory Visit: Payer: Self-pay

## 2021-05-28 ENCOUNTER — Ambulatory Visit (INDEPENDENT_AMBULATORY_CARE_PROVIDER_SITE_OTHER): Payer: Medicare Other | Admitting: Pulmonary Disease

## 2021-05-28 ENCOUNTER — Encounter: Payer: Self-pay | Admitting: Nurse Practitioner

## 2021-05-28 DIAGNOSIS — E2839 Other primary ovarian failure: Secondary | ICD-10-CM | POA: Diagnosis not present

## 2021-05-28 DIAGNOSIS — Z9989 Dependence on other enabling machines and devices: Secondary | ICD-10-CM | POA: Diagnosis not present

## 2021-05-28 DIAGNOSIS — G4733 Obstructive sleep apnea (adult) (pediatric): Secondary | ICD-10-CM

## 2021-05-28 DIAGNOSIS — Z0000 Encounter for general adult medical examination without abnormal findings: Secondary | ICD-10-CM

## 2021-05-28 NOTE — Assessment & Plan Note (Signed)
CPAP is working well at 11 cm. 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 Advised against medications with sedative side effects Cautioned against driving when sleepy - understanding that sleepiness will vary on a day to day basis

## 2021-05-28 NOTE — Progress Notes (Signed)
Subjective:   Ann Hamilton is a 75 y.o. female who presents for Medicare Annual (Subsequent) preventive examination.  Review of Systems     Cardiac Risk Factors include: advanced age (>77men, >74 women);obesity (BMI >30kg/m2);dyslipidemia     Objective:    There were no vitals filed for this visit. There is no height or weight on file to calculate BMI.  Advanced Directives 05/28/2021 12/12/2020 08/29/2020 05/28/2020 05/20/2020 11/26/2019 05/18/2019  Does Patient Have a Medical Advance Directive? Yes Yes Yes Yes Yes Yes Yes  Type of Advance Directive Living will Living will Living will Living will Living will Living will -  Does patient want to make changes to medical advance directive? No - Patient declined No - Patient declined No - Patient declined No - Patient declined No - Patient declined No - Patient declined -  Copy of Healthcare Power of Attorney in Chart? - - - - - - -  Would patient like information on creating a medical advance directive? - - - - - - -    Current Medications (verified) Outpatient Encounter Medications as of 05/28/2021  Medication Sig   atorvastatin (LIPITOR) 40 MG tablet Take 1 tablet (40 mg total) by mouth daily.   Calcium Carb-Cholecalciferol (CALCIUM 1000 + D PO) Take 1 tablet by mouth daily.   calcium carbonate (TUMS - DOSED IN MG ELEMENTAL CALCIUM) 500 MG chewable tablet Chew 1 tablet by mouth daily.   cholecalciferol (VITAMIN D) 1000 units tablet Take 1,000 Units by mouth 2 (two) times daily. D3   fluticasone (FLONASE) 50 MCG/ACT nasal spray Place 2 sprays into both nostrils as needed for allergies or rhinitis.   levothyroxine (SYNTHROID) 75 MCG tablet TAKE 1 TABLET BY MOUTH EVERY DAY BEFORE BREAKFAST   naproxen sodium (ANAPROX) 220 MG tablet Take 220 mg by mouth as needed.   omeprazole (PRILOSEC) 20 MG capsule Take 20 mg by mouth daily. As needed   Polyethyl Glycol-Propyl Glycol (SYSTANE FREE OP) Apply 1-2 drops to eye 2 (two) times daily.    solifenacin (VESICARE) 5 MG tablet TAKE 1 TABLET BY MOUTH EVERY DAY   [DISCONTINUED] COVID-19 mRNA bivalent vaccine, Pfizer, (PFIZER COVID-19 VAC BIVALENT) injection Inject into the muscle.   [DISCONTINUED] COVID-19 mRNA Vac-TriS, Pfizer, (PFIZER-BIONT COVID-19 VAC-TRIS) SUSP injection Inject into the muscle.   No facility-administered encounter medications on file as of 05/28/2021.    Allergies (verified) Patient has no known allergies.   History: Past Medical History:  Diagnosis Date   Hearing loss    High cholesterol    Hypersomnia    Per Records from [facility]    Nocturia    Per Records from [facility]    Snoring    Per [facility] from [facility]    Thyroid disease    Past Surgical History:  Procedure Laterality Date   CATARACT EXTRACTION, BILATERAL  07/12/2014   CESAREAN SECTION     AGE 58   COLONOSCOPY     10 YEARS AGO AS OF 2018    EYE SURGERY  04/2020   Family History  Problem Relation Age of Onset   Osteoporosis Mother    Heart disease Father    Cancer Father    Heart failure Father    Breast cancer Neg Hx    Social History   Socioeconomic History   Marital status: Married    Spouse name: Not on file   Number of children: Not on file   Years of education: Not on  file   Highest education level: Not on file  Occupational History   Not on file  Tobacco Use   Smoking status: Former    Packs/day: 1.00    Years: 3.00    Pack years: 3.00    Types: Cigarettes   Smokeless tobacco: Never   Tobacco comments:    Quit 50 years ago as of 2019   Vaping Use   Vaping Use: Never used  Substance and Sexual Activity   Alcohol use: Yes    Comment: 2 drinks weekly    Drug use: No   Sexual activity: Not Currently  Other Topics Concern   Not on file  Social History Narrative   Diet: No      Caffeine: Yes      Married, if yes what year: Yes, 1968      Do you live in a house, apartment, assisted living, condo, trailer, ect:  Museum/gallery curator, one stories, 2 persons       Pets: No       Current/Past profession: Charity fundraiser      Exercise: Yes, 3 times weekly, [employer]         Living Will: Yes   DNR: No   POA/HPOA: No      Questions below were not included in New Patient Packet      Functional Status:   Do you have difficulty bathing or dressing yourself?   Do you have difficulty preparing food or eating?   Do you have difficulty managing your medications?   Do you have difficulty managing your finances?   Do you have difficulty affording your medications?   Social Determinants of Health   Financial Resource Strain: Not on file  Food Insecurity: Not on file  Transportation Needs: Not on file  Physical Activity: Not on file  Stress: Not on file  Social Connections: Not on file    Tobacco Counseling Counseling given: Not Answered Tobacco comments: Quit 50 years ago as of 2019    Clinical Intake:  Pre-visit preparation completed: Yes  Pain : No/denies pain     BMI - recorded: 32 Nutritional Status: BMI > 30  Obese Diabetes: No  How often do you need to have someone help you when you read instructions, pamphlets, or other written materials from your doctor or pharmacy?: 1 - Never  Diabetic?no         Activities of Daily Living In your present state of health, do you have any difficulty performing the following activities: 05/28/2021  Hearing? N  Vision? N  Difficulty concentrating or making decisions? N  Walking or climbing stairs? N  Dressing or bathing? N  Doing errands, shopping? N  Preparing Food and eating ? N  Using the Toilet? N  In the past six months, have you accidently leaked urine? N  Do you have problems with loss of bowel control? N  Managing your Medications? N  Managing your Finances? N  Housekeeping or managing your Housekeeping? N  Some recent data might be hidden    Patient Care Team: Sharon Seller, NP as PCP - General (Geriatric Medicine) Serena Colonel, MD as  Consulting Physician (Otolaryngology)  Indicate any recent Medical Services you may have received from other than [facility] providers in the past year (date may be approximate).     Assessment:   This is a routine wellness examination for [place].  Hearing/Vision screen Hearing Screening - Comments:: Patient wears hearing aids Vision Screening - Comments:: Patient has no vision problems. Patient  has eye appointment in December. Patient sees Dr. Nile [facility]  Dietary issues and exercise activities discussed: Current Exercise Habits: Structured exercise class;Home exercise routine, Type of exercise: walking, Time (Minutes): 30, Frequency (Times/Week): 5, Weekly Exercise (Minutes/Week): 150   Goals Addressed   None    Depression Screen PHQ 2/9 Scores 05/28/2021 12/12/2020 05/20/2020 05/18/2019 05/15/2018 04/27/2018 03/30/2017  PHQ - 2 Score 0 0 0 0 0 0 0    Fall Risk Fall Risk  05/28/2021 12/12/2020 05/28/2020 05/20/2020 11/26/2019  Falls in the past year? 0 0 0 0 0  Number falls in past yr: 0 0 0 0 0  Injury with Fall? 0 0 0 0 0  Risk for fall due to : No Fall Risks - - - -  Follow up Falls evaluation completed - - - -    FALL RISK PREVENTION PERTAINING TO THE HOME:  Any stairs in or around the home? Yes  If so, are there any without handrails? No  Home free of loose throw rugs in walkways, pet beds, electrical cords, etc? Yes  Adequate lighting in your home to reduce risk of falls? Yes   ASSISTIVE DEVICES UTILIZED TO PREVENT FALLS:  Life alert? No  Use of a cane, walker or w/c? No  Grab bars in the bathroom? Yes  Shower chair or bench in shower? Yes  Elevated toilet seat or a handicapped toilet? Yes   TIMED UP AND GO:  Was the test performed? No .   Cognitive Function: MMSE - Mini Mental State Exam 05/15/2018 05/05/2017  Orientation to time 4 5  Orientation to Place 5 5  Registration 3 3  Attention/ Calculation 5 5  Recall 3 3  Language- name 2 objects 2 2  Language- repeat 1 1   Language- follow 3 step command 3 3  Language- read & follow direction 1 1  Write a sentence 1 1  Copy design 1 1  Total score 29 30     6CIT Screen 05/28/2021 05/20/2020 05/18/2019  What Year? 0 points 0 points 0 points  What month? 0 points 0 points 0 points  What time? 0 points 0 points 0 points  Count back from 20 0 points 0 points 0 points  Months in reverse 0 points 0 points 0 points  Repeat phrase 0 points 0 points 0 points  Total Score 0 0 0    Immunizations Immunization History  Administered Date(s) Administered   Fluad Quad(high Dose 65+) 04/27/2019, 04/10/2020, 03/23/2021   Influenza, High Dose Seasonal PF 03/30/2017, 04/27/2018   Influenza-Unspecified 04/11/2016   PFIZER Comirnaty(Gray Top)Covid-19 Tri-Sucrose Vaccine 12/23/2020   PFIZER(Purple Top)SARS-COV-2 Vaccination 08/17/2019, 09/11/2019, 04/17/2020   Pfizer Covid-19 Vaccine Bivalent Booster 45yrs & up 04/23/2021   Pneumococcal Conjugate-13 06/21/2014   Pneumococcal Polysaccharide-23 05/13/2010   Td 07/12/2009   Tdap 01/08/2009   Zoster Recombinat (Shingrix) 06/16/2019, 10/07/2019    TDAP status: Due, Education has been provided regarding the importance of this vaccine. Advised may receive this vaccine at local pharmacy or Health Dept. Aware to provide a copy of the vaccination record if obtained from local pharmacy or Health Dept. Verbalized acceptance and understanding.  Flu Vaccine status: Up to date  Pneumococcal vaccine status: Up to date  Covid-19 vaccine status: Information provided on how to obtain vaccines.   Qualifies for Shingles Vaccine? Yes   Zostavax completed No   Shingrix Completed?: Yes  Screening Tests Health Maintenance  Topic Date Due   TETANUS/TDAP  07/13/2019   Pneumonia Vaccine 65+  Years old  Completed   INFLUENZA VACCINE  Completed   DEXA SCAN  Completed   COVID-19 Vaccine  Completed   Hepatitis C Screening  Completed   Zoster Vaccines- Shingrix  Completed   HPV VACCINES   Aged Out   Fecal DNA (Cologuard)  Discontinued    Health Maintenance  Health Maintenance Due  Topic Date Due   TETANUS/TDAP  07/13/2019    Colorectal cancer screening: No longer required.   Mammogram status: No longer required due to age.  Bone Density status: Ordered today. Pt provided with contact info and advised to call to schedule appt.  Lung Cancer Screening: (Low Dose CT Chest recommended if Age 75-80 years, 30 pack-year currently smoking OR have quit w/in 15years.) does not qualify.    Additional Screening:  Hepatitis C Screening: does qualify; Completed   Vision Screening: Recommended annual ophthalmology exams for early detection of glaucoma and other disorders of the eye. Is the patient up to date with their annual eye exam?  Yes  Who is the provider or what is the name of the office in which the patient attends annual eye exams? [facility] If pt is not established with a provider, would they like to be referred to a provider to establish care? No .   Dental Screening: Recommended annual dental exams for proper oral hygiene  Community Resource Referral / Chronic Care Management: CRR required this visit?  No   CCM required this visit?  No      Plan:     I have personally reviewed and noted the following in the patient's chart:   Medical and social history Use of alcohol, tobacco or illicit drugs  Current medications and supplements including opioid prescriptions.  Functional ability and status Nutritional status Physical activity Advanced directives List of other physicians Hospitalizations, surgeries, and ER visits in previous 12 months Vitals Screenings to include cognitive, depression, and falls Referrals and appointments  In addition, I have reviewed and discussed with patient certain preventive protocols, quality metrics, and best practice recommendations. A written personalized care plan for preventive services as well as general preventive health  recommendations were provided to patient.     Sharon Seller, NP   05/28/2021    Virtual Visit via Telephone Note  I connected with pt on 05/28/21 at  4:15 PM EST by telephone and verified that I am speaking with the correct person using two identifiers.  Location: Patient: home Provider: [facility]    I discussed the limitations, risks, security and privacy concerns of performing an evaluation and management service by telephone and the availability of in person appointments. I also discussed with the patient that there may be a patient responsible charge related to this service. The patient expressed understanding and agreed to proceed.   I discussed the assessment and treatment plan with the patient. The patient was provided an opportunity to ask questions and all were answered. The patient agreed with the plan and demonstrated an understanding of the instructions.   The patient was advised to call back or seek an in-person evaluation if the symptoms worsen or if the condition fails to improve as anticipated.  I provided 15 minutes of non-face-to-face time during this encounter.  Janene Harvey. Biag[employer] printed and mailed

## 2021-05-28 NOTE — Progress Notes (Signed)
This service is provided via telemedicine  No vital signs collected/recorded due to the encounter was a telemedicine visit.   Location of patient (ex: home, work):  Home  Patient consents to a telephone visit:  Yes, see encounter dated 05/28/2021  Location of the provider (ex: office, home):  [facility]   Name of any referring provider:  N/A  Names of all persons participating in the telemedicine service and their role in the encounter:  [name], Nurse Practitioner, Elveria Royals, CMA, and patient.   Time spent on call:  15 minutes with medical assistant

## 2021-05-28 NOTE — Patient Instructions (Signed)
CPAP 11 cm is working well Decrease ramp time x 3 mins Turn humidifier ON

## 2021-05-28 NOTE — Patient Instructions (Signed)
Ann Hamilton , Thank you for taking time to come for your Medicare Wellness Visit. I appreciate your ongoing commitment to your health goals. Please review the following plan we discussed and let me know if I can assist you in the future.   Screening recommendations/referrals: Colonoscopy aged out Mammogram up to date Bone Density call to schedule [phone] Recommended yearly ophthalmology/optometry visit for glaucoma screening and checkup Recommended yearly dental visit for hygiene and checkup  Vaccinations: Influenza vaccine up to date Pneumococcal vaccine up to date Tdap vaccine RECOMMENDED to get at local pharmacy Shingles vaccine up to date    Advanced directives: on file.   Conditions/risks identified: advance age, BMI <30  Next appointment: yearly for AWV   Preventive Care 10 Years and Older, Female Preventive care refers to lifestyle choices and visits with your health care provider that can promote health and wellness. What does preventive care include? A yearly physical exam. This is also called an annual well check. Dental exams once or twice a year. Routine eye exams. Ask your health care provider how often you should have your eyes checked. Personal lifestyle choices, including: Daily care of your teeth and gums. Regular physical activity. Eating a healthy diet. Avoiding tobacco and drug use. Limiting alcohol use. Practicing safe sex. Taking low-dose aspirin every day. Taking vitamin and mineral supplements as recommended by your health care provider. What happens during an annual well check? The services and screenings done by your health care provider during your annual well check will depend on your age, overall health, lifestyle risk factors, and family history of disease. Counseling  Your health care provider may ask you questions about your: Alcohol use. Tobacco use. Drug use. Emotional well-being. Home and relationship well-being. Sexual  activity. Eating habits. History of falls. Memory and ability to understand (cognition). Work and work Astronomer. Reproductive health. Screening  You may have the following tests or measurements: Height, weight, and BMI. Blood pressure. Lipid and cholesterol levels. These may be checked every 5 years, or more frequently if you are over 17 years old. Skin check. Lung cancer screening. You may have this screening every year starting at age 38 if you have a 30-pack-year history of smoking and currently smoke or have quit within the past 15 years. Fecal occult blood test (FOBT) of the stool. You may have this test every year starting at age 59. Flexible sigmoidoscopy or colonoscopy. You may have a sigmoidoscopy every 5 years or a colonoscopy every 10 years starting at age 51. Hepatitis C blood test. Hepatitis B blood test. Sexually transmitted disease (STD) testing. Diabetes screening. This is done by checking your blood sugar (glucose) after you have not eaten for a while (fasting). You may have this done every 1-3 years. Bone density scan. This is done to screen for osteoporosis. You may have this done starting at age 23. Mammogram. This may be done every 1-2 years. Talk to your health care provider about how often you should have regular mammograms. Talk with your health care provider about your test results, treatment options, and if necessary, the need for more tests. Vaccines  Your health care provider may recommend certain vaccines, such as: Influenza vaccine. This is recommended every year. Tetanus, diphtheria, and acellular pertussis (Tdap, Td) vaccine. You may need a Td booster every 10 years. Zoster vaccine. You may need this after age 39. Pneumococcal 13-valent conjugate (PCV13) vaccine. One dose is recommended after age 50. Pneumococcal polysaccharide (PPSV23) vaccine. One dose is recommended after age  37. Talk to your health care provider about which screenings and vaccines  you need and how often you need them. This information is not intended to replace advice given to you by your health care provider. Make sure you discuss any questions you have with your health care provider. Document Released: 07/25/2015 Document Revised: 03/17/2016 Document Reviewed: 04/29/2015 Elsevier Interactive Patient Education  2017 [place] Prevention in the Home Falls can cause injuries. They can happen to people of all ages. There are many things you can do to make your home safe and to help prevent falls. What can I do on the outside of my home? Regularly fix the edges of walkways and driveways and fix any cracks. Remove anything that might make you trip as you walk through a door, such as a raised step or threshold. Trim any bushes or trees on the path to your home. Use bright outdoor lighting. Clear any walking paths of anything that might make someone trip, such as rocks or tools. Regularly check to see if handrails are loose or broken. Make sure that both sides of any steps have handrails. Any raised decks and porches should have guardrails on the edges. Have any leaves, snow, or ice cleared regularly. Use sand or salt on walking paths during winter. Clean up any spills in your garage right away. This includes oil or grease spills. What can I do in the bathroom? Use night lights. Install grab bars by the toilet and in the tub and shower. Do not use towel bars as grab bars. Use non-skid mats or decals in the tub or shower. If you need to sit down in the shower, use a plastic, non-slip stool. Keep the floor dry. Clean up any water that spills on the floor as soon as it happens. Remove soap buildup in the tub or shower regularly. Attach bath mats securely with double-sided non-slip rug tape. Do not have throw rugs and other things on the floor that can make you trip. What can I do in the bedroom? Use night lights. Make sure that you have a light by your bed that  is easy to reach. Do not use any sheets or blankets that are too big for your bed. They should not hang down onto the floor. Have a firm chair that has side arms. You can use this for support while you get dressed. Do not have throw rugs and other things on the floor that can make you trip. What can I do in the kitchen? Clean up any spills right away. Avoid walking on wet floors. Keep items that you use a lot in easy-to-reach places. If you need to reach something above you, use a strong step stool that has a grab bar. Keep electrical cords out of the way. Do not use floor polish or wax that makes floors slippery. If you must use wax, use non-skid floor wax. Do not have throw rugs and other things on the floor that can make you trip. What can I do with my stairs? Do not leave any items on the stairs. Make sure that there are handrails on both sides of the stairs and use them. Fix handrails that are broken or loose. Make sure that handrails are as long as the stairways. Check any carpeting to make sure that it is firmly attached to the stairs. Fix any carpet that is loose or worn. Avoid having throw rugs at the top or bottom of the stairs. If you do have  throw rugs, attach them to the floor with carpet tape. Make sure that you have a light switch at the top of the stairs and the bottom of the stairs. If you do not have them, ask someone to add them for you. What else can I do to help prevent falls? Wear shoes that: Do not have high heels. Have rubber bottoms. Are comfortable and fit you well. Are closed at the toe. Do not wear sandals. If you use a stepladder: Make sure that it is fully opened. Do not climb a closed stepladder. Make sure that both sides of the stepladder are locked into place. Ask someone to hold it for you, if possible. Clearly mark and make sure that you can see: Any grab bars or handrails. First and last steps. Where the edge of each step is. Use tools that help you  move around (mobility aids) if they are needed. These include: Canes. Walkers. Scooters. Crutches. Turn on the lights when you go into a dark area. Replace any light bulbs as soon as they burn out. Set up your furniture so you have a clear path. Avoid moving your furniture around. If any of your floors are uneven, fix them. If there are any pets around you, be aware of where they are. Review your medicines with your doctor. Some medicines can make you feel dizzy. This can increase your chance of falling. Ask your doctor what other things that you can do to help prevent falls. This information is not intended to replace advice given to you by your health care provider. Make sure you discuss any questions you have with your health care provider. Document Released: 04/24/2009 Document Revised: 12/04/2015 Document Reviewed: 08/02/2014 Elsevier Interactive Patient Education  2017 [employer].

## 2021-05-28 NOTE — Progress Notes (Signed)
   Subjective:    Patient ID: Ann Hamilton, female    DOB: [birth year], 75 y.o.   MRN: [id number]  HPI  75 yo woman for follow-up of moderate OSA  Annual follow-up visit. She feels well and is compliant with her CPAP machine.  She finally got a new replacement Philips machine a month ago and this feels "different" but she is adjusting well to it. No problems with mask or pressure.   CPAP download was reviewed which shows excellent control of events on 11 cm with good compliance more than 8 hours every night, no residual events and minimal leak. Review of detail setting shows ramp time of 45 minutes in the humidifier setting is off  She is planning a trip to the [place] with her family including grandkids  Significant tests/ events reviewed 2017 NPSG - AHI of 15/hour with lowest desaturation around 81%.    Review of Systems neg for any significant sore throat, dysphagia, itching, sneezing, nasal congestion or excess/ purulent secretions, fever, chills, sweats, unintended wt loss, pleuritic or exertional cp, hempoptysis, orthopnea pnd or change in chronic leg swelling. Also denies presyncope, palpitations, heartburn, abdominal pain, nausea, vomiting, diarrhea or change in bowel or urinary habits, dysuria,hematuria, rash, arthralgias, visual complaints, headache, numbness weakness or ataxia.     Objective:   Physical Exam  Gen. Pleasant, obese, in no distress ENT - no lesions, no post nasal drip Neck: No JVD, no thyromegaly, no carotid bruits Lungs: no use of accessory muscles, no dullness to percussion, decreased without rales or rhonchi  Cardiovascular: Rhythm regular, heart sounds  normal, no murmurs or gallops, no peripheral edema Musculoskeletal: No deformities, no cyanosis or clubbing , no tremors        Assessment & Plan:   Weight loss encouraged and the correlation between OSA and weight was discussed

## 2021-05-28 NOTE — Telephone Encounter (Signed)
[name], Ann Hamilton are scheduled for a virtual visit with your provider today.    Just as we do with appointments in the office, we must obtain your consent to participate.  Your consent will be active for this visit and any virtual visit you may have with one of our providers in the next 365 days.    If you have a MyChart account, I can also send a copy of this consent to you electronically.  All virtual visits are billed to your insurance company just like a traditional visit in the office.  As this is a virtual visit, video technology does not allow for your provider to perform a traditional examination.  This may limit your provider's ability to fully assess your condition.  If your provider identifies any concerns that need to be evaluated in person or the need to arrange testing such as labs, EKG, etc, we will make arrangements to do so.    Although advances in technology are sophisticated, we cannot ensure that it will always work on either your end or our end.  If the connection with a video visit is poor, we may have to switch to a telephone visit.  With either a video or telephone visit, we are not always able to ensure that we have a secure connection.   I need to obtain your verbal consent now.   Are you willing to proceed with your visit today?   Ann Hamilton has provided verbal consent on 05/28/2021 for a virtual visit (video or telephone).   Ann Hamilton, [facility] 05/28/2021  3:49 PM

## 2021-06-15 DIAGNOSIS — H9113 Presbycusis, bilateral: Secondary | ICD-10-CM | POA: Diagnosis not present

## 2021-06-15 DIAGNOSIS — Z974 Presence of external hearing-aid: Secondary | ICD-10-CM | POA: Diagnosis not present

## 2021-06-16 ENCOUNTER — Other Ambulatory Visit: Payer: Medicare Other

## 2021-06-19 ENCOUNTER — Ambulatory Visit: Payer: Medicare Other | Admitting: Nurse Practitioner

## 2021-06-24 DIAGNOSIS — Z961 Presence of intraocular lens: Secondary | ICD-10-CM | POA: Diagnosis not present

## 2021-07-16 ENCOUNTER — Other Ambulatory Visit: Payer: Self-pay | Admitting: Nurse Practitioner

## 2021-07-21 ENCOUNTER — Other Ambulatory Visit: Payer: Medicare Other

## 2021-07-21 ENCOUNTER — Other Ambulatory Visit: Payer: Self-pay

## 2021-07-21 DIAGNOSIS — M1712 Unilateral primary osteoarthritis, left knee: Secondary | ICD-10-CM

## 2021-07-21 DIAGNOSIS — E034 Atrophy of thyroid (acquired): Secondary | ICD-10-CM | POA: Diagnosis not present

## 2021-07-21 DIAGNOSIS — E782 Mixed hyperlipidemia: Secondary | ICD-10-CM | POA: Diagnosis not present

## 2021-07-21 DIAGNOSIS — J302 Other seasonal allergic rhinitis: Secondary | ICD-10-CM

## 2021-07-22 LAB — CBC WITH DIFFERENTIAL/PLATELET
Absolute Monocytes: 552 cells/uL (ref 200–950)
Basophils Absolute: 48 cells/uL (ref 0–200)
Basophils Relative: 0.8 %
Eosinophils Absolute: 252 cells/uL (ref 15–500)
Eosinophils Relative: 4.2 %
HCT: 41.7 % (ref 35.0–45.0)
Hemoglobin: 14.1 g/dL (ref 11.7–15.5)
Lymphs Abs: 1254 cells/uL (ref 850–3900)
MCH: 32.8 pg (ref 27.0–33.0)
MCHC: 33.8 g/dL (ref 32.0–36.0)
MCV: 97 fL (ref 80.0–100.0)
MPV: 11.1 fL (ref 7.5–12.5)
Monocytes Relative: 9.2 %
Neutro Abs: 3894 cells/uL (ref 1500–7800)
Neutrophils Relative %: 64.9 %
Platelets: 298 10*3/uL (ref 140–400)
RBC: 4.3 10*6/uL (ref 3.80–5.10)
RDW: 12.3 % (ref 11.0–15.0)
Total Lymphocyte: 20.9 %
WBC: 6 10*3/uL (ref 3.8–10.8)

## 2021-07-22 LAB — LIPID PANEL
Cholesterol: 203 mg/dL — ABNORMAL HIGH (ref <200)
HDL: 54 mg/dL (ref >=50)
LDL Cholesterol (Calc): 130 mg/dL (calc) — ABNORMAL HIGH
Non-HDL Cholesterol (Calc): 149 mg/dL (calc) — ABNORMAL HIGH (ref <130)
Total CHOL/HDL Ratio: 3.8 (calc) (ref <5.0)
Triglycerides: 90 mg/dL (ref <150)

## 2021-07-22 LAB — COMPREHENSIVE METABOLIC PANEL
AG Ratio: 1.5 (calc) (ref 1.0–2.5)
ALT: 16 U/L (ref 6–29)
AST: 21 U/L (ref 10–35)
Albumin: 4 g/dL (ref 3.6–5.1)
Alkaline phosphatase (APISO): 54 U/L (ref 37–153)
BUN: 24 mg/dL (ref 7–25)
CO2: 30 mmol/L (ref 20–32)
Calcium: 9.3 mg/dL (ref 8.6–10.4)
Chloride: 103 mmol/L (ref 98–110)
Creat: 0.96 mg/dL (ref 0.60–1.00)
Globulin: 2.7 g/dL (calc) (ref 1.9–3.7)
Glucose, Bld: 82 mg/dL (ref 65–99)
Potassium: 4.4 mmol/L (ref 3.5–5.3)
Sodium: 141 mmol/L (ref 135–146)
Total Bilirubin: 0.6 mg/dL (ref 0.2–1.2)
Total Protein: 6.7 g/dL (ref 6.1–8.1)

## 2021-07-22 LAB — TSH: TSH: 3.12 mIU/L (ref 0.40–4.50)

## 2021-07-24 ENCOUNTER — Ambulatory Visit: Payer: Medicare Other | Admitting: Nurse Practitioner

## 2021-07-31 ENCOUNTER — Other Ambulatory Visit: Payer: Self-pay | Admitting: Nurse Practitioner

## 2021-07-31 DIAGNOSIS — Z1231 Encounter for screening mammogram for malignant neoplasm of breast: Secondary | ICD-10-CM

## 2021-08-03 ENCOUNTER — Encounter: Payer: Self-pay | Admitting: Nurse Practitioner

## 2021-08-03 ENCOUNTER — Other Ambulatory Visit: Payer: Self-pay

## 2021-08-03 ENCOUNTER — Ambulatory Visit (INDEPENDENT_AMBULATORY_CARE_PROVIDER_SITE_OTHER): Payer: Medicare Other | Admitting: Nurse Practitioner

## 2021-08-03 VITALS — BP 128/84 | HR 84 | Temp 97.1°F | Ht 62.0 in | Wt 175.0 lb

## 2021-08-03 DIAGNOSIS — H9193 Unspecified hearing loss, bilateral: Secondary | ICD-10-CM | POA: Diagnosis not present

## 2021-08-03 DIAGNOSIS — E034 Atrophy of thyroid (acquired): Secondary | ICD-10-CM | POA: Diagnosis not present

## 2021-08-03 DIAGNOSIS — E669 Obesity, unspecified: Secondary | ICD-10-CM | POA: Diagnosis not present

## 2021-08-03 DIAGNOSIS — M720 Palmar fascial fibromatosis [Dupuytren]: Secondary | ICD-10-CM | POA: Diagnosis not present

## 2021-08-03 DIAGNOSIS — M8580 Other specified disorders of bone density and structure, unspecified site: Secondary | ICD-10-CM | POA: Diagnosis not present

## 2021-08-03 DIAGNOSIS — E782 Mixed hyperlipidemia: Secondary | ICD-10-CM | POA: Diagnosis not present

## 2021-08-03 NOTE — Patient Instructions (Addendum)
Tdap at the pharmacy   Start taking Lipitor at night   If you do not hear back about referral in a week please call our office back and ask to speak with Lattie Haw (our referral coordinator) for an update.

## 2021-08-03 NOTE — Progress Notes (Signed)
Careteam: Patient Care Team: Lauree Chandler, NP as PCP - General (Geriatric Medicine) Izora Gala, MD as Consulting Physician (Otolaryngology)  PLACE OF SERVICE:  [place] Directive information    No Known Allergies  Chief Complaint  Patient presents with   Medical Management of Chronic Issues    6 month follow-up and discuss need for td/tdap or post pone. Patient requesting a referral to a hand Surgeon      HPI: Patient is a 76 y.o. female for routine follow up.   Dupuytren's contracture- She went to a hand surgeon and they did not do the injection that she wanted.   Overactive bladder- doing well on vesicare, no side effects noted.   Hyperlipidemia- family hx. She is tolerating lipitor 40 mg, she is taking medication in the morning. Diet has been off over the last few month.   She is scheduled for her mammogram and bone density.   She just quit work, said she is going to formally retire by July. She is exercising every morning at this time.  She is doing ROM exercises.   Review of Systems:  Review of Systems  Constitutional:  Negative for chills, fever and weight loss.  HENT:  Positive for hearing loss. Negative for tinnitus.   Respiratory:  Negative for cough, sputum production and shortness of breath.   Cardiovascular:  Negative for chest pain, palpitations and leg swelling.  Gastrointestinal:  Negative for abdominal pain, constipation, diarrhea and heartburn.  Genitourinary:  Negative for dysuria, frequency and urgency.  Musculoskeletal:  Positive for joint pain. Negative for back pain, falls and myalgias.       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   Neurological:  Negative for dizziness and headaches.  Psychiatric/Behavioral:  Negative for depression and memory loss. The patient does not have insomnia.    Past Medical History:  Diagnosis Date   Hearing loss    High cholesterol    Hypersomnia    Per Records from [facility]    Nocturia    Per Records from [facility]    Snoring    Per Records from [facility]    Thyroid disease    Past Surgical History:  Procedure Laterality Date   CATARACT EXTRACTION, BILATERAL  07/12/2014   CESAREAN SECTION     AGE 52   COLONOSCOPY     10 YEARS AGO AS OF 2018    EYE SURGERY  04/2020   Social History:   reports that she has quit smoking. Her smoking use included cigarettes. She has a 3.00 pack-year smoking history. She has never used smokeless tobacco. She reports current alcohol use. She reports that she does not use drugs.  Family History  Problem Relation Age of Onset   Osteoporosis Mother    Heart disease Father    Cancer Father    Heart failure Father    Heart attack Sister    Breast cancer Neg Hx     Medications: Patient's Medications  New Prescriptions   No medications on file  Previous Medications   ATORVASTATIN (LIPITOR) 40 MG TABLET    Take 1 tablet (40 mg total) by mouth daily.   CALCIUM CARB-CHOLECALCIFEROL (CALCIUM 1000 + D PO)    Take 1 tablet by mouth daily.   CALCIUM CARBONATE (TUMS - DOSED IN MG ELEMENTAL CALCIUM) 500 MG CHEWABLE TABLET    Chew 1 tablet by mouth daily.   CHOLECALCIFEROL (VITAMIN D) 1000 UNITS  TABLET    Take 1,000 Units by mouth 2 (two) times daily. D3   FLUTICASONE (FLONASE) 50 MCG/ACT NASAL SPRAY    Place 2 sprays into both nostrils as needed for allergies or rhinitis.   LEVOTHYROXINE (SYNTHROID) 75 MCG TABLET    TAKE 1 TABLET BY MOUTH EVERY DAY BEFORE BREAKFAST   NAPROXEN SODIUM (ANAPROX) 220 MG TABLET    Take 220 mg by mouth as needed.   OMEPRAZOLE (PRILOSEC) 20 MG CAPSULE    Take 20 mg by mouth daily. As needed   POLYETHYL GLYCOL-PROPYL GLYCOL (SYSTANE FREE OP)    Apply 1-2 drops to eye 2 (two) times daily.   SOLIFENACIN (VESICARE) 5 MG TABLET    TAKE 1 TABLET BY MOUTH EVERY DAY  Modified Medications   No medications on file  Discontinued Medications   No medications on file     Physical Exam:  Vitals:   08/03/21 1547  BP: 128/84  Pulse: 84  Temp: (!) 97.1 F (36.2 C)  TempSrc: Temporal  SpO2: 99%  Weight: 175 lb (79.4 kg)  Height: _0  (1.575 m)   Body mass index is 32.01 kg/m. Wt Readings from Last 3 Encounters:  08/03/21 175 lb (79.4 kg)  05/28/21 175 lb 3.2 oz (79.5 kg)  12/12/20 165 lb 12.8 oz (75.2 kg)    Physical Exam Constitutional:      General: She is not in acute distress.    Appearance: She is well-developed. She is not diaphoretic.  HENT:     Head: Normocephalic and atraumatic.     Ears:     Comments: Hearing aids bilaterally     Mouth/Throat:     Pharynx: No oropharyngeal exudate.  Eyes:     Conjunctiva/sclera: Conjunctivae normal.     Pupils: Pupils are equal, round, and reactive to light.  Cardiovascular:     Rate and Rhythm: Normal rate and regular rhythm.     Heart sounds: Normal heart sounds.  Pulmonary:     Effort: Pulmonary effort is normal.     Breath sounds: Normal breath sounds.  Abdominal:     General: Bowel sounds are normal.     Palpations: Abdomen is soft.  Musculoskeletal:     Cervical back: Normal range of motion and neck supple.     Right lower leg: No edema.     Left lower leg: No edema.  Skin:    General: Skin is warm and dry.  Neurological:     Mental Status: She is alert.  Psychiatric:        Mood and Affect: Mood normal.    Labs reviewed: Basic Metabolic Panel: Recent Labs    12/09/20 0840 07/21/21 0814  NA 142 141  K 5.0 4.4  CL 106 103  CO2 30 30  GLUCOSE 89 82  BUN 24 24  CREATININE 0.88 0.96  CALCIUM 9.4 9.3  TSH  --  3.12   Liver Function Tests: Recent Labs    12/09/20 0840 07/21/21 0814  AST 22 21  ALT 20 16  BILITOT 0.5 0.6  PROT 6.7 6.7   No results for input(s): LIPASE, AMYLASE in the last 8760 hours. No results for input(s): AMMONIA in the last 8760 hours. CBC: Recent Labs    07/21/21 0814  WBC 6.0  NEUTROABS 3,894  HGB 14.1  HCT 41.7  MCV 97.0   PLT 298   Lipid Panel: Recent Labs    12/09/20 0840 07/21/21 0814  CHOL 200* 203*  HDL 55 54  LDLCALC 125* 130*  TRIG 94 90  CHOLHDL 3.6 3.8   TSH: Recent Labs    07/21/21 0814  TSH 3.12   A1C: No results found for: HGBA1C   Assessment/Plan 1. Dupuytren's contracture - Ambulatory referral to Orthopedic Surgery  2. Mixed hyperlipidemia -continue on lipitor with dietary modifications.  - Lipid Panel; Future - CMP with eGFR([facility]); Future - CBC with Differential/Platelet; Future  3. Hypothyroidism due to acquired atrophy of thyroid -continues on synthroid TSH at goal  4. Bilateral hearing loss, unspecified hearing loss type -continues with bilateral hearing aides   5. Osteopenia, unspecified location -Recommended to take calcium 600 mg twice daily with Vitamin D 2000 units daily and weight bearing activity 30 mins/5 days a week - CMP with eGFR([facility]); Future  6. Obesity (BMI 30-39.9) --education provided on healthy weight loss through increase in physical activity and proper nutrition     Next appt: 6 months.  Carlos American. [place], [facility] [phone]

## 2021-08-04 ENCOUNTER — Ambulatory Visit
Admission: RE | Admit: 2021-08-04 | Discharge: 2021-08-04 | Disposition: A | Discharge status: Home / Self Care | Payer: Medicare Other | Source: Ambulatory Visit

## 2021-08-04 DIAGNOSIS — Z1231 Encounter for screening mammogram for malignant neoplasm of breast: Secondary | ICD-10-CM | POA: Diagnosis not present

## 2021-08-06 DIAGNOSIS — M720 Palmar fascial fibromatosis [Dupuytren]: Secondary | ICD-10-CM | POA: Diagnosis not present

## 2021-08-12 ENCOUNTER — Other Ambulatory Visit: Payer: Self-pay | Admitting: Nurse Practitioner

## 2021-08-12 DIAGNOSIS — E034 Atrophy of thyroid (acquired): Secondary | ICD-10-CM

## 2021-11-11 ENCOUNTER — Ambulatory Visit
Admission: RE | Admit: 2021-11-11 | Discharge: 2021-11-11 | Disposition: A | Discharge status: Home / Self Care | Payer: Medicare Other | Source: Ambulatory Visit | Attending: Nurse Practitioner | Admitting: Nurse Practitioner

## 2021-11-11 DIAGNOSIS — Z780 Asymptomatic menopausal state: Secondary | ICD-10-CM | POA: Diagnosis not present

## 2021-11-11 DIAGNOSIS — E2839 Other primary ovarian failure: Secondary | ICD-10-CM

## 2021-12-02 ENCOUNTER — Other Ambulatory Visit: Payer: Self-pay | Admitting: Nurse Practitioner

## 2021-12-02 DIAGNOSIS — E782 Mixed hyperlipidemia: Secondary | ICD-10-CM

## 2022-01-02 ENCOUNTER — Other Ambulatory Visit: Payer: Self-pay | Admitting: Nurse Practitioner

## 2022-01-29 ENCOUNTER — Other Ambulatory Visit: Payer: Medicare Other

## 2022-02-01 ENCOUNTER — Ambulatory Visit: Payer: Medicare Other | Admitting: Nurse Practitioner

## 2022-02-05 ENCOUNTER — Other Ambulatory Visit: Payer: Self-pay | Admitting: Nurse Practitioner

## 2022-02-05 DIAGNOSIS — E034 Atrophy of thyroid (acquired): Secondary | ICD-10-CM

## 2022-03-10 ENCOUNTER — Other Ambulatory Visit: Payer: Medicare Other

## 2022-03-10 DIAGNOSIS — E782 Mixed hyperlipidemia: Secondary | ICD-10-CM

## 2022-03-10 DIAGNOSIS — M8580 Other specified disorders of bone density and structure, unspecified site: Secondary | ICD-10-CM | POA: Diagnosis not present

## 2022-03-10 LAB — LIPID PANEL
Cholesterol: 177 mg/dL (ref <200)
HDL: 53 mg/dL (ref >=50)
LDL Cholesterol (Calc): 107 mg/dL (calc) — ABNORMAL HIGH
Non-HDL Cholesterol (Calc): 124 mg/dL (calc) (ref <130)
Total CHOL/HDL Ratio: 3.3 (calc) (ref <5.0)
Triglycerides: 80 mg/dL (ref <150)

## 2022-03-10 LAB — CBC WITH DIFFERENTIAL/PLATELET
Absolute Monocytes: 493 cells/uL (ref 200–950)
Basophils Absolute: 38 cells/uL (ref 0–200)
Basophils Relative: 0.6 %
Eosinophils Absolute: 282 cells/uL (ref 15–500)
Eosinophils Relative: 4.4 %
HCT: 41.5 % (ref 35.0–45.0)
Hemoglobin: 13.8 g/dL (ref 11.7–15.5)
Lymphs Abs: 1331 cells/uL (ref 850–3900)
MCH: 32.2 pg (ref 27.0–33.0)
MCHC: 33.3 g/dL (ref 32.0–36.0)
MCV: 97 fL (ref 80.0–100.0)
MPV: 10.8 fL (ref 7.5–12.5)
Monocytes Relative: 7.7 %
Neutro Abs: 4256 cells/uL (ref 1500–7800)
Neutrophils Relative %: 66.5 %
Platelets: 272 10*3/uL (ref 140–400)
RBC: 4.28 10*6/uL (ref 3.80–5.10)
RDW: 12.1 % (ref 11.0–15.0)
Total Lymphocyte: 20.8 %
WBC: 6.4 10*3/uL (ref 3.8–10.8)

## 2022-03-10 LAB — COMPLETE METABOLIC PANEL WITH GFR
AG Ratio: 1.4 (calc) (ref 1.0–2.5)
ALT: 16 U/L (ref 6–29)
AST: 18 U/L (ref 10–35)
Albumin: 3.8 g/dL (ref 3.6–5.1)
Alkaline phosphatase (APISO): 50 U/L (ref 37–153)
BUN: 25 mg/dL (ref 7–25)
CO2: 30 mmol/L (ref 20–32)
Calcium: 9.2 mg/dL (ref 8.6–10.4)
Chloride: 107 mmol/L (ref 98–110)
Creat: 0.97 mg/dL (ref 0.60–1.00)
Globulin: 2.8 g/dL (calc) (ref 1.9–3.7)
Glucose, Bld: 85 mg/dL (ref 65–99)
Potassium: 5 mmol/L (ref 3.5–5.3)
Sodium: 142 mmol/L (ref 135–146)
Total Bilirubin: 0.5 mg/dL (ref 0.2–1.2)
Total Protein: 6.6 g/dL (ref 6.1–8.1)
eGFR: 60 mL/min/{1.73_m2} (ref >=60)

## 2022-03-11 ENCOUNTER — Encounter: Payer: Self-pay | Admitting: Nurse Practitioner

## 2022-03-12 ENCOUNTER — Encounter: Payer: Self-pay | Admitting: Nurse Practitioner

## 2022-03-12 ENCOUNTER — Ambulatory Visit (INDEPENDENT_AMBULATORY_CARE_PROVIDER_SITE_OTHER): Payer: Medicare Other | Admitting: Nurse Practitioner

## 2022-03-12 VITALS — BP 122/80 | HR 80 | Temp 97.8°F | Ht 62.0 in | Wt 179.0 lb

## 2022-03-12 DIAGNOSIS — J302 Other seasonal allergic rhinitis: Secondary | ICD-10-CM

## 2022-03-12 DIAGNOSIS — E782 Mixed hyperlipidemia: Secondary | ICD-10-CM

## 2022-03-12 DIAGNOSIS — H9193 Unspecified hearing loss, bilateral: Secondary | ICD-10-CM | POA: Diagnosis not present

## 2022-03-12 DIAGNOSIS — M8580 Other specified disorders of bone density and structure, unspecified site: Secondary | ICD-10-CM

## 2022-03-12 DIAGNOSIS — E034 Atrophy of thyroid (acquired): Secondary | ICD-10-CM | POA: Diagnosis not present

## 2022-03-12 DIAGNOSIS — E669 Obesity, unspecified: Secondary | ICD-10-CM

## 2022-03-12 NOTE — Progress Notes (Signed)
Careteam: Patient Care Team: Sharon Seller, NP as PCP - General (Geriatric Medicine) Serena Colonel, MD as Consulting Physician (Otolaryngology)  PLACE OF SERVICE:  [facility]  Advanced Directive information Does Patient Have a Medical Advance Directive?: Yes, Type of Advance Directive: Living will, Does patient want to make changes to medical advance directive?: No - Patient declined  No Known Allergies  Chief Complaint  Patient presents with   Medical Management of Chronic Issues    6 month follow-up and discuss labs. Discuss need for td/tdap, covid booster, and flu vaccine (out of stock). [facility] verified.      HPI: Patient is a 76 y.o. female routine follow up. Has no complaints or concerns.  Gerd: well controlled .  Overactive bladder: states it becomes a problem when she has too much caffeine, but her symptoms are controlled.   States her sinuses are flaring up but doesn't do any OTC as she doesn't want to add more medications. Symptoms aren't that bad and overall improving  Exercises twice a week, takes zumba classes.   She stopped working since last visit. Husband had knee surgery and she took him back and forth to appts.    Review of Systems:  Review of Systems  Constitutional:  Negative for chills, fever and weight loss.  HENT:  Positive for congestion and hearing loss.   Eyes:  Negative for blurred vision.  Respiratory:  Negative for cough and shortness of breath.   Cardiovascular:  Negative for chest pain, palpitations and leg swelling.  Gastrointestinal:  Negative for abdominal pain, diarrhea and heartburn.  Genitourinary:  Positive for frequency. Negative for dysuria and urgency.  Musculoskeletal:  Negative for back pain, falls and joint pain.  Skin:  Negative for itching and rash.  Neurological:  Negative for dizziness and headaches.  Psychiatric/Behavioral:  Negative for depression. The patient is not nervous/anxious.     Past Medical History:   Diagnosis Date   Hearing loss    High cholesterol    Hypersomnia    Per Records from [facility]    Nocturia    Per Records from [facility]    Snoring    Per Records from [facility]    Thyroid disease    Past Surgical History:  Procedure Laterality Date   CATARACT EXTRACTION, BILATERAL  07/12/2014   CESAREAN SECTION     AGE 73   COLONOSCOPY     10 YEARS AGO AS OF 2018    EYE SURGERY  04/2020   Social History:   reports that she has quit smoking. Her smoking use included cigarettes. She has a 3.00 pack-year smoking history. She has never used smokeless tobacco. She reports current alcohol use. She reports that she does not use drugs.  Family History  Problem Relation Age of Onset   Osteoporosis Mother    Heart disease Father    Cancer Father    Heart failure Father    Heart attack Sister    Breast cancer Neg Hx     Medications: Patient's Medications  New Prescriptions   No medications on file  Previous Medications   ATORVASTATIN (LIPITOR) 40 MG TABLET    TAKE 1 TABLET BY MOUTH EVERY DAY   CALCIUM CARB-CHOLECALCIFEROL (CALCIUM 1000 + D PO)    Take 1 tablet by mouth daily.   CALCIUM CARBONATE (TUMS - DOSED IN MG ELEMENTAL CALCIUM) 500 MG CHEWABLE TABLET    Chew 1 tablet by mouth daily.   CHOLECALCIFEROL (  VITAMIN D) 1000 UNITS TABLET    Take 1,000 Units by mouth 2 (two) times daily. D3   FLUTICASONE (FLONASE) 50 MCG/ACT NASAL SPRAY    Place 2 sprays into both nostrils as needed for allergies or rhinitis.   LEVOTHYROXINE (SYNTHROID) 75 MCG TABLET    TAKE 1 TABLET (75 MCG TOTAL) BY MOUTH DAILY BEFORE BREAKFAST. E03.4   NAPROXEN SODIUM (ANAPROX) 220 MG TABLET    Take 220 mg by mouth as needed.   OMEPRAZOLE (PRILOSEC) 20 MG CAPSULE    Take 20 mg by mouth daily. As needed   POLYETHYL GLYCOL-PROPYL GLYCOL (SYSTANE FREE OP)    Apply 1-2 drops to eye 2 (two) times daily.   SOLIFENACIN (VESICARE) 5 MG TABLET    TAKE 1 TABLET BY MOUTH EVERY DAY   Modified Medications   No medications on file  Discontinued Medications   No medications on file    Physical Exam:  Vitals:   03/12/22 0906  BP: (!) 136/90  Pulse: 80  Temp: 97.8 F (36.6 C)  TempSrc: Temporal  SpO2: 99%  Weight: 81.2 kg  Height: 5\' 2"  (1.575 m)   Body mass index is 32.74 kg/m. Wt Readings from Last 3 Encounters:  03/12/22 81.2 kg  08/03/21 79.4 kg  05/28/21 79.5 kg    Physical Exam Constitutional:      General: She is not in acute distress. HENT:     Right Ear: Tympanic membrane, ear canal and external ear normal. There is no impacted cerumen.     Left Ear: Tympanic membrane, ear canal and external ear normal. There is no impacted cerumen.     Ears:     Comments: Bilateral hearing aides    Mouth/Throat:     Mouth: Mucous membranes are moist.     Pharynx: Oropharynx is clear.  Eyes:     Conjunctiva/sclera: Conjunctivae normal.     Pupils: Pupils are equal, round, and reactive to light.  Cardiovascular:     Rate and Rhythm: Normal rate and regular rhythm.     Pulses: Normal pulses.     Heart sounds: Normal heart sounds. No murmur heard. Pulmonary:     Effort: Pulmonary effort is normal. No respiratory distress.     Breath sounds: Normal breath sounds. No wheezing.  Abdominal:     General: Bowel sounds are normal. There is no distension.     Palpations: Abdomen is soft.     Tenderness: There is no abdominal tenderness.  Musculoskeletal:     Right lower leg: No edema.     Left lower leg: No edema.  Skin:    General: Skin is warm and dry.  Neurological:     Mental Status: She is alert and oriented to person, place, and time. Mental status is at baseline.     Motor: No weakness.  Psychiatric:        Mood and Affect: Mood normal.        Behavior: Behavior normal.     Labs reviewed: Basic Metabolic Panel: Recent Labs    07/21/21 0814 03/10/22 0814  NA 141 142  K 4.4 5.0  CL 103 107  CO2 30 30  GLUCOSE 82 85  BUN 24 25   CREATININE 0.96 0.97  CALCIUM 9.3 9.2  TSH 3.12  --    Liver Function Tests: Recent Labs    07/21/21 0814 03/10/22 0814  AST 21 18  ALT 16 16  BILITOT 0.6 0.5  PROT 6.7 6.6   No results  for input(s): "LIPASE", "AMYLASE" in the last 8760 hours. No results for input(s): "AMMONIA" in the last 8760 hours. CBC: Recent Labs    07/21/21 0814 03/10/22 0814  WBC 6.0 6.4  NEUTROABS 3,894 4,256  HGB 14.1 13.8  HCT 41.7 41.5  MCV 97.0 97.0  PLT 298 272   Lipid Panel: Recent Labs    07/21/21 0814 03/10/22 0814  CHOL 203* 177  HDL 54 53  LDLCALC 130* 107*  TRIG 90 80  CHOLHDL 3.8 3.3   TSH: Recent Labs    07/21/21 0814  TSH 3.12   A1C: No results found for: "HGBA1C"   Assessment/Plan 1. Mixed hyperlipidemia - continue lipitor and to continue dietary modifications  2. Hypothyroidism due to acquired atrophy of thyroid - continue synthroid, will follow up TSH prior to next visit.   3. Osteopenia, unspecified location - continue vitamin d - recommend weight bearing exercises   4. Bilateral hearing loss, unspecified hearing loss type -stable,  has bilateral hearing aides  5. Obesity (BMI 30-39.9) - continued lifestyle modifications  6. Seasonal allergies - recommend OTC medication if symptoms become too bothersome. - continue flonase  Return in about 6 months (around 09/10/2022) for routine follow up, labs prior to visit .:   Student- Leroy Sea, RN -I personally was present during the history, physical exam and medical decision-making activities of this service and have verified that the service and findings are accurately documented in the student's note  Dorian Renfro K. Biagio Borg  [facility] [phone]

## 2022-03-16 ENCOUNTER — Other Ambulatory Visit (HOSPITAL_BASED_OUTPATIENT_CLINIC_OR_DEPARTMENT_OTHER): Payer: Self-pay

## 2022-03-16 MED ORDER — AREXVY 120 MCG/0.5ML IM SUSR
INTRAMUSCULAR | 0 refills | Status: DC
  Filled 2022-03-16: qty 0.5, 1d supply, fill #0

## 2022-04-05 ENCOUNTER — Other Ambulatory Visit (HOSPITAL_BASED_OUTPATIENT_CLINIC_OR_DEPARTMENT_OTHER): Payer: Self-pay

## 2022-04-05 DIAGNOSIS — Z23 Encounter for immunization: Secondary | ICD-10-CM | POA: Diagnosis not present

## 2022-04-05 MED ORDER — FLUAD QUADRIVALENT 0.5 ML IM PRSY
PREFILLED_SYRINGE | INTRAMUSCULAR | 0 refills | Status: DC
  Filled 2022-04-05: qty 0.5, 1d supply, fill #0

## 2022-04-23 DIAGNOSIS — M25561 Pain in right knee: Secondary | ICD-10-CM | POA: Diagnosis not present

## 2022-05-03 ENCOUNTER — Other Ambulatory Visit (HOSPITAL_BASED_OUTPATIENT_CLINIC_OR_DEPARTMENT_OTHER): Payer: Self-pay

## 2022-05-03 DIAGNOSIS — Z23 Encounter for immunization: Secondary | ICD-10-CM | POA: Diagnosis not present

## 2022-05-03 MED ORDER — COMIRNATY 30 MCG/0.3ML IM SUSY
PREFILLED_SYRINGE | INTRAMUSCULAR | 0 refills | Status: DC
  Filled 2022-05-03: qty 0.3, 1d supply, fill #0

## 2022-05-13 ENCOUNTER — Ambulatory Visit (INDEPENDENT_AMBULATORY_CARE_PROVIDER_SITE_OTHER): Payer: Medicare Other | Admitting: Primary Care

## 2022-05-13 ENCOUNTER — Encounter: Payer: Self-pay | Admitting: Primary Care

## 2022-05-13 VITALS — BP 132/70 | HR 82 | Temp 97.9°F | Ht 62.0 in | Wt 180.2 lb

## 2022-05-13 DIAGNOSIS — G4733 Obstructive sleep apnea (adult) (pediatric): Secondary | ICD-10-CM

## 2022-05-13 NOTE — Patient Instructions (Signed)
Excellent compliance Your obstructive sleep apnea is currently well controlled on current CPAP pressure settings, no changes needed today She continue to wear every night for minimum 4 to 6 hours able to  Orders Renew CPAP supplies with aero flow  Follow-up 1 year with Beth NP for CPAP compliance- can be virtual  CPAP and BIPAP Information CPAP and BIPAP are methods that use air pressure to keep your airways open and to help you breathe well. CPAP and BIPAP use different amounts of pressure. Your health care provider will tell you whether CPAP or BIPAP would be more helpful for you. CPAP stands for "continuous positive airway pressure." With CPAP, the amount of pressure stays the same while you breathe in (inhale) and out (exhale). BIPAP stands for "bi-level positive airway pressure." With BIPAP, the amount of pressure will be higher when you inhale and lower when you exhale. This allows you to take larger breaths. CPAP or BIPAP may be used in the hospital, or your health care provider may want you to use it at home. You may need to have a sleep study before your health care provider can order a machine for you to use at home. What are the advantages? CPAP or BIPAP can be helpful if you have: Sleep apnea. Chronic obstructive pulmonary disease (COPD). Heart failure. Medical conditions that cause muscle weakness, including muscular dystrophy or amyotrophic lateral sclerosis (ALS). Other problems that cause breathing to be shallow, weak, abnormal, or difficult. CPAP and BIPAP are most commonly used for obstructive sleep apnea (OSA) to keep the airways from collapsing when the muscles relax during sleep. What are the risks? Generally, this is a safe treatment. However, problems may occur, including: Irritated skin or skin sores if the mask does not fit properly. Dry or stuffy nose or nosebleeds. Dry mouth. Feeling gassy or bloated. Sinus or lung infection if the equipment is not cleaned  properly. When should CPAP or BIPAP be used? In most cases, the mask only needs to be worn during sleep. Generally, the mask needs to be worn throughout the night and during any daytime naps. People with certain medical conditions may also need to wear the mask at other times, such as when they are awake. Follow instructions from your health care provider about when to use the machine. What happens during CPAP or BIPAP?  Both CPAP and BIPAP are provided by a small machine with a flexible plastic tube that attaches to a plastic mask that you wear. Air is blown through the mask into your nose or mouth. The amount of pressure that is used to blow the air can be adjusted on the machine. Your health care provider will set the pressure setting and help you find the best mask for you. Tips for using the mask Because the mask needs to be snug, some people feel trapped or closed-in (claustrophobic) when first using the mask. If you feel this way, you may need to get used to the mask. One way to do this is to hold the mask loosely over your nose or mouth and then gradually apply the mask more snugly. You can also gradually increase the amount of time that you use the mask. Masks are available in various types and sizes. If your mask does not fit well, talk with your health care provider about getting a different one. Some common types of masks include: Full face masks, which fit over the mouth and nose. Nasal masks, which fit over the nose. Nasal pillow or  prong masks, which fit into the nostrils. If you are using a mask that fits over your nose and you tend to breathe through your mouth, a chin strap may be applied to help keep your mouth closed. Use a skin barrier to protect your skin as told by your health care provider. Some CPAP and BIPAP machines have alarms that may sound if the mask comes off or develops a leak. If you have trouble with the mask, it is very important that you talk with your health care  provider about finding a way to make the mask easier to tolerate. Do not stop using the mask. There could be a negative impact on your health if you stop using the mask. Tips for using the machine Place your CPAP or BIPAP machine on a secure table or stand near an electrical outlet. Know where the on/off switch is on the machine. Follow instructions from your health care provider about how to set the pressure on your machine and when you should use it. Do not eat or drink while the CPAP or BIPAP machine is on. Food or fluids could get pushed into your lungs by the pressure of the CPAP or BIPAP. For home use, CPAP and BIPAP machines can be rented or purchased through home health care companies. Many different brands of machines are available. Renting a machine before purchasing may help you find out which particular machine works well for you. Your health insurance company may also decide which machine you may get. Keep the CPAP or BIPAP machine and attachments clean. Ask your health care provider for specific instructions. Check the humidifier if you have a dry stuffy nose or nosebleeds. Make sure it is working correctly. Follow these instructions at home: Take over-the-counter and prescription medicines only as told by your health care provider. Ask if you can take sinus medicine if your sinuses are blocked. Do not use any products that contain nicotine or tobacco. These products include cigarettes, chewing tobacco, and vaping devices, such as e-cigarettes. If you need help quitting, ask your health care provider. Keep all follow-up visits. This is important. Contact a health care provider if: You have redness or pressure sores on your head, face, mouth, or nose from the mask or head gear. You have trouble using the CPAP or BIPAP machine. You cannot tolerate wearing the CPAP or BIPAP mask. Someone tells you that you snore even when wearing your CPAP or BIPAP. Get help right away if: You have  trouble breathing. You feel confused. Summary CPAP and BIPAP are methods that use air pressure to keep your airways open and to help you breathe well. If you have trouble with the mask, it is very important that you talk with your health care provider about finding a way to make the mask easier to tolerate. Do not stop using the mask. There could be a negative impact to your health if you stop using the mask. Follow instructions from your health care provider about when to use the machine. This information is not intended to replace advice given to you by your health care provider. Make sure you discuss any questions you have with your health care provider. Document Revised: 02/04/2021 Document Reviewed: 06/06/2020 Elsevier Patient Education  [place].

## 2022-05-13 NOTE — Progress Notes (Signed)
@Patient  ID: , female    DOB: [birth year], 76 y.o.   MRN: [id number]  Chief Complaint  Patient presents with   Follow-up    Cpap compliance    Referring provider: [id number], NP  HPI: 76 year old female, former light smoker quit in 2019.  History significant for OSA on CPAP, hypothyroidism, osteopenia, mixed hyperlipidemia, obesity.  05/13/2022 Patient presents today for annual OSA Follow-up. She is doing very well today without acute findings.  No issues with mask fit or pressure settings.  She is 100% compliant with CPAP use over the 30 days.  Average usage 8 hours 25 minutes. Current CPAP pressure 11 cm H2O; Residual AHI 0.5. DME company is [employer]. She has a DreamStation auto CPAP.  Current machine is <2 years ago. She uses full face mask, overall it works ok. She fights with at times. She is a mouth breather. She changes her mask once a month. She sleeps very well at night. She gets 8-9 hours of sleep a night. She feels rested when she wakes up in the morning. No daytime sleepiness.    No Known Allergies  Immunization History  Administered Date(s) Administered   COVID-19, mRNA, vaccine(Comirnaty)12 years and older 05/03/2022   Fluad Quad(high Dose 65+) 04/27/2019, 04/10/2020, 03/23/2021, 03/02/2022   Influenza, High Dose Seasonal PF 03/30/2017, 04/27/2018   Influenza-Unspecified 04/11/2016   PFIZER Comirnaty(Gray Top)Covid-19 Tri-Sucrose Vaccine 12/23/2020   PFIZER(Purple Top)SARS-COV-2 Vaccination 08/17/2019, 09/11/2019, 04/17/2020   Pfizer Covid-19 Vaccine Bivalent Booster 45yrs & up 04/23/2021   Pneumococcal Conjugate-13 06/21/2014   Pneumococcal Polysaccharide-23 05/13/2010   Respiratory Syncytial Virus Vaccine,Recomb Aduvanted(Arexvy) 03/16/2022   Td 07/12/2009   Tdap 01/08/2009   Zoster Recombinat (Shingrix) 06/16/2019, 10/07/2019    Past Medical History:  Diagnosis Date   Hearing loss    High cholesterol    Hypersomnia    Per  Records from [facility]    Nocturia    Per Records from [facility]    Snoring    Per Records from [facility]    Thyroid disease     Tobacco History: Social History   Tobacco Use  Smoking Status Former   Packs/day: 1.00   Years: 3.00   Total pack years: 3.00   Types: Cigarettes  Smokeless Tobacco Never  Tobacco Comments   Quit 50 years ago as of 2019    Counseling given: Not Answered Tobacco comments: Quit 50 years ago as of 2019    Outpatient Medications Prior to Visit  Medication Sig Dispense Refill   atorvastatin (LIPITOR) 40 MG tablet TAKE 1 TABLET BY MOUTH EVERY DAY 90 tablet 3   Calcium Carb-Cholecalciferol (CALCIUM 1000 + D PO) Take 1 tablet by mouth daily.     calcium carbonate (TUMS - DOSED IN MG ELEMENTAL CALCIUM) 500 MG chewable tablet Chew 1 tablet by mouth daily.     cholecalciferol (VITAMIN D) 1000 units tablet Take 1,000 Units by mouth 2 (two) times daily. D3     COVID-19 mRNA vaccine 2023-2024 (COMIRNATY) syringe Inject into the muscle. 0.3 mL 0   fluticasone (FLONASE) 50 MCG/ACT nasal spray Place 2 sprays into both nostrils as needed for allergies or rhinitis.     influenza vaccine adjuvanted (FLUAD QUADRIVALENT) 0.5 ML injection Inject into the muscle. 0.5 mL 0   levothyroxine (SYNTHROID) 75 MCG tablet TAKE 1 TABLET (75 MCG TOTAL) BY MOUTH DAILY BEFORE BREAKFAST. E03.4 90 tablet 1   naproxen sodium (ANAPROX) 220 MG tablet Take 220 mg  by mouth as needed.     omeprazole (PRILOSEC) 20 MG capsule Take 20 mg by mouth daily. As needed     Polyethyl Glycol-Propyl Glycol (SYSTANE FREE OP) Apply 1-2 drops to eye 2 (two) times daily.     RSV vaccine recomb adjuvanted (AREXVY) 120 MCG/0.5ML injection Inject into the muscle. 0.5 mL 0   solifenacin (VESICARE) 5 MG tablet TAKE 1 TABLET BY MOUTH EVERY DAY 90 tablet 1   No facility-administered medications prior to visit.   Review of Systems  Review of Systems  Constitutional: Negative.   Negative for fatigue.  HENT: Negative.    Respiratory: Negative.    Cardiovascular: Negative.  Negative for leg swelling.  Psychiatric/Behavioral:  Negative for sleep disturbance.    Physical Exam  BP 132/70 (BP Location: Left Arm, Cuff Size: Normal)   Pulse 82   Temp 97.9 F (36.6 C)   Ht 5\' 2"  (1.575 m)   Wt 180 lb 3.2 oz (81.7 kg)   SpO2 99%   BMI 32.96 kg/m  Physical Exam Constitutional:      General: She is not in acute distress.    Appearance: Normal appearance. She is not ill-appearing.  HENT:     Head: Normocephalic and atraumatic.     Mouth/Throat:     Mouth: Mucous membranes are moist.     Pharynx: Oropharynx is clear.  Cardiovascular:     Rate and Rhythm: Normal rate and regular rhythm.  Pulmonary:     Effort: Pulmonary effort is normal.     Breath sounds: Normal breath sounds.  Musculoskeletal:        General: Normal range of motion.  Skin:    General: Skin is warm and dry.  Neurological:     General: No focal deficit present.     Mental Status: She is alert and oriented to person, place, and time. Mental status is at baseline.  Psychiatric:        Mood and Affect: Mood normal.        Behavior: Behavior normal.        Thought Content: Thought content normal.        Judgment: Judgment normal.      Lab Results:  CBC    Component Value Date/Time   WBC 6.4 03/10/2022 0814   RBC 4.28 03/10/2022 0814   HGB 13.8 03/10/2022 0814   HCT 41.5 03/10/2022 0814   PLT 272 03/10/2022 0814   MCV 97.0 03/10/2022 0814   MCH 32.2 03/10/2022 0814   MCHC 33.3 03/10/2022 0814   RDW 12.1 03/10/2022 0814   LYMPHSABS 1,331 03/10/2022 0814   EOSABS 282 03/10/2022 0814   BASOSABS 38 03/10/2022 0814    BMET    Component Value Date/Time   NA 142 03/10/2022 0814   NA 138 09/14/2016 0000   K 5.0 03/10/2022 0814   CL 107 03/10/2022 0814   CO2 30 03/10/2022 0814   GLUCOSE 85 03/10/2022 0814   BUN 25 03/10/2022 0814   BUN 11 09/14/2016 0000   CREATININE 0.97  03/10/2022 0814   CALCIUM 9.2 03/10/2022 0814   GFRNONAA 64 12/09/2020 0840   GFRAA 74 12/09/2020 0840    BNP No results found for: "BNP"  ProBNP No results found for: "PROBNP"  Imaging: No results found.   Assessment & Plan:   OSA on CPAP - Well controlled. She is 100% compliant with CPAP > 4 hours in the last 30 days.  Current pressure setting 11 cm H2O; AHI 0.5/hr. No  changes needed today. Renew CPAP supplies with [employer].  Advised patient to continue to wear her CPAP every night for 4-6 hours or longer. Encourage weight loss efforts.  FU in 1 year or sooner if needed.    Martyn Ehrich, NP 05/13/2022

## 2022-05-13 NOTE — Assessment & Plan Note (Addendum)
-   Well controlled. She is 100% compliant with CPAP > 4 hours in the last 30 days.  Current pressure setting 11 cm H2O; AHI 0.5/hr. No changes needed today. Renew CPAP supplies with [employer].  Advised patient to continue to wear her CPAP every night for 4-6 hours or longer. Encourage weight loss efforts.  FU in 1 year or sooner if needed.

## 2022-05-25 ENCOUNTER — Encounter: Payer: Self-pay | Admitting: Primary Care

## 2022-06-08 ENCOUNTER — Telehealth: Payer: Self-pay | Admitting: Primary Care

## 2022-06-08 NOTE — Telephone Encounter (Signed)
Order was placed 11/2 after pt's last OV.  Called and spoke with pt letting her know that this had been placed at last OV and stated to her that I was going to check with [facility] about this to see if they could provide [place] with any update and she verbalized understanding.  Routing to [facility] for review. Please advise.

## 2022-06-08 NOTE — Telephone Encounter (Signed)
Patient is calling regarding her prescription for her supplies from [employer].  She stated that the doctor told her on 11/2 that she would send it in and the company said they have not gotten that order yet.  Please advise and call patient with an update asap.  CB# [phone]

## 2022-06-08 NOTE — Telephone Encounter (Signed)
I just spoke with Leonette Most from [facility] and he stated that they had faxed Rx request on 11/27 and 11/28 to fax # [phone]. I told him to send to the [facility] fax # [phone]. He was sending it as we were on the phone the form for Beth to sign

## 2022-06-10 ENCOUNTER — Encounter: Payer: Self-pay | Admitting: Nurse Practitioner

## 2022-06-10 ENCOUNTER — Ambulatory Visit (INDEPENDENT_AMBULATORY_CARE_PROVIDER_SITE_OTHER): Payer: Medicare Other | Admitting: Nurse Practitioner

## 2022-06-10 ENCOUNTER — Encounter: Payer: Medicare Other | Admitting: Nurse Practitioner

## 2022-06-10 DIAGNOSIS — Z0000 Encounter for general adult medical examination without abnormal findings: Secondary | ICD-10-CM | POA: Diagnosis not present

## 2022-06-10 NOTE — Patient Instructions (Signed)
Please contact your local pharmacy, previous provider, or insurance carrier for vaccine/immunization records. Ensure that any procedures done outside of [facility] are faxed to [place] [phone] or you can sign release of records form at the front desk to keep your medical record updated.    Ann Hamilton , Thank you for taking time to come for your Medicare Wellness Visit. I appreciate your ongoing commitment to your health goals. Please review the following plan we discussed and let me know if I can assist you in the future.   Screening recommendations/referrals: Colonoscopy aged out Mammogram aged out, continues to get yearly Bone Density up to date Recommended yearly ophthalmology/optometry visit for glaucoma screening and checkup Recommended yearly dental visit for hygiene and checkup  Vaccinations: Influenza vaccine- due annually in September/October Pneumococcal vaccine up to date Tdap vaccine - declined Shingles vaccine up to date    Advanced directives: on file  Conditions/risks identified: advanced age, obesity  Next appointment: yearly, next in person    Preventive Care 27 Years and Older, Female Preventive care refers to lifestyle choices and visits with your health care provider that can promote health and wellness. What does preventive care include? A yearly physical exam. This is also called an annual well check. Dental exams once or twice a year. Routine eye exams. Ask your health care provider how often you should have your eyes checked. Personal lifestyle choices, including: Daily care of your teeth and gums. Regular physical activity. Eating a healthy diet. Avoiding tobacco and drug use. Limiting alcohol use. Practicing safe sex. Taking low-dose aspirin every day. Taking vitamin and mineral supplements as recommended by your health care provider. What happens during an annual well check? The services and screenings done by your  health care provider during your annual well check will depend on your age, overall health, lifestyle risk factors, and family history of disease. Counseling  Your health care provider may ask you questions about your: Alcohol use. Tobacco use. Drug use. Emotional well-being. Home and relationship well-being. Sexual activity. Eating habits. History of falls. Memory and ability to understand (cognition). Work and work Statistician. Reproductive health. Screening  You may have the following tests or measurements: Height, weight, and BMI. Blood pressure. Lipid and cholesterol levels. These may be checked every 5 years, or more frequently if you are over 28 years old. Skin check. Lung cancer screening. You may have this screening every year starting at age 81 if you have a 30-pack-year history of smoking and currently smoke or have quit within the past 15 years. Fecal occult blood test (FOBT) of the stool. You may have this test every year starting at age 82. Flexible sigmoidoscopy or colonoscopy. You may have a sigmoidoscopy every 5 years or a colonoscopy every 10 years starting at age 75. Hepatitis C blood test. Hepatitis B blood test. Sexually transmitted disease (STD) testing. Diabetes screening. This is done by checking your blood sugar (glucose) after you have not eaten for a while (fasting). You may have this done every 1-3 years. Bone density scan. This is done to screen for osteoporosis. You may have this done starting at age 81. Mammogram. This may be done every 1-2 years. Talk to your health care provider about how often you should have regular mammograms. Talk with your health care provider about your test results, treatment options, and if necessary, the need for more tests. Vaccines  Your health care provider may recommend certain vaccines, such as: Influenza vaccine. This  is recommended every year. Tetanus, diphtheria, and acellular pertussis (Tdap, Td) vaccine. You may  need a Td booster every 10 years. Zoster vaccine. You may need this after age 97. Pneumococcal 13-valent conjugate (PCV13) vaccine. One dose is recommended after age 87. Pneumococcal polysaccharide (PPSV23) vaccine. One dose is recommended after age 37. Talk to your health care provider about which screenings and vaccines you need and how often you need them. This information is not intended to replace advice given to you by your health care provider. Make sure you discuss any questions you have with your health care provider. Document Released: 07/25/2015 Document Revised: 03/17/2016 Document Reviewed: 04/29/2015 Elsevier Interactive Patient Education  2017 [place] Prevention in the Home Falls can cause injuries. They can happen to people of all ages. There are many things you can do to make your home safe and to help prevent falls. What can I do on the outside of my home? Regularly fix the edges of walkways and driveways and fix any cracks. Remove anything that might make you trip as you walk through a door, such as a raised step or threshold. Trim any bushes or trees on the path to your home. Use bright outdoor lighting. Clear any walking paths of anything that might make someone trip, such as rocks or tools. Regularly check to see if handrails are loose or broken. Make sure that both sides of any steps have handrails. Any raised decks and porches should have guardrails on the edges. Have any leaves, snow, or ice cleared regularly. Use sand or salt on walking paths during winter. Clean up any spills in your garage right away. This includes oil or grease spills. What can I do in the bathroom? Use night lights. Install grab bars by the toilet and in the tub and shower. Do not use towel bars as grab bars. Use non-skid mats or decals in the tub or shower. If you need to sit down in the shower, use a plastic, non-slip stool. Keep the floor dry. Clean up any water that spills on  the floor as soon as it happens. Remove soap buildup in the tub or shower regularly. Attach bath mats securely with double-sided non-slip rug tape. Do not have throw rugs and other things on the floor that can make you trip. What can I do in the bedroom? Use night lights. Make sure that you have a light by your bed that is easy to reach. Do not use any sheets or blankets that are too big for your bed. They should not hang down onto the floor. Have a firm chair that has side arms. You can use this for support while you get dressed. Do not have throw rugs and other things on the floor that can make you trip. What can I do in the kitchen? Clean up any spills right away. Avoid walking on wet floors. Keep items that you use a lot in easy-to-reach places. If you need to reach something above you, use a strong step stool that has a grab bar. Keep electrical cords out of the way. Do not use floor polish or wax that makes floors slippery. If you must use wax, use non-skid floor wax. Do not have throw rugs and other things on the floor that can make you trip. What can I do with my stairs? Do not leave any items on the stairs. Make sure that there are handrails on both sides of the stairs and use them. Fix handrails that  are broken or loose. Make sure that handrails are as long as the stairways. Check any carpeting to make sure that it is firmly attached to the stairs. Fix any carpet that is loose or worn. Avoid having throw rugs at the top or bottom of the stairs. If you do have throw rugs, attach them to the floor with carpet tape. Make sure that you have a light switch at the top of the stairs and the bottom of the stairs. If you do not have them, ask someone to add them for you. What else can I do to help prevent falls? Wear shoes that: Do not have high heels. Have rubber bottoms. Are comfortable and fit you well. Are closed at the toe. Do not wear sandals. If you use a stepladder: Make sure  that it is fully opened. Do not climb a closed stepladder. Make sure that both sides of the stepladder are locked into place. Ask someone to hold it for you, if possible. Clearly mark and make sure that you can see: Any grab bars or handrails. First and last steps. Where the edge of each step is. Use tools that help you move around (mobility aids) if they are needed. These include: Canes. Walkers. Scooters. Crutches. Turn on the lights when you go into a dark area. Replace any light bulbs as soon as they burn out. Set up your furniture so you have a clear path. Avoid moving your furniture around. If any of your floors are uneven, fix them. If there are any pets around you, be aware of where they are. Review your medicines with your doctor. Some medicines can make you feel dizzy. This can increase your chance of falling. Ask your doctor what other things that you can do to help prevent falls. This information is not intended to replace advice given to you by your health care provider. Make sure you discuss any questions you have with your health care provider. Document Released: 04/24/2009 Document Revised: 12/04/2015 Document Reviewed: 08/02/2014 Elsevier Interactive Patient Education  2017 [employer].

## 2022-06-10 NOTE — Progress Notes (Addendum)
   This service is provided via telemedicine  No vital signs collected/recorded due to the encounter was a telemedicine visit.   Location of patient (ex: home, work):  Home  Patient consents to a telephone visit:  Yes  Location of the provider (ex: office, home):  [place]  Name of any referring provider:  Eubanks, Jessica K, NP   Names of all persons participating in the telemedicine service and their role in the encounter:  Patient, Ann Hamilton, RMA, [name], NP.    Time spent on call:  8 minutes spent on the phone with Medical Assistant.    

## 2022-06-10 NOTE — Progress Notes (Signed)
Subjective:   Ann Hamilton is a 76 y.o. female who presents for Medicare Annual (Subsequent) preventive examination.  Review of Systems     Cardiac Risk Factors include: advanced age (>56men, >64 women);dyslipidemia;obesity (BMI >30kg/m2)     Objective:    There were no vitals filed for this visit. There is no height or weight on file to calculate BMI.     03/12/2022    9:08 AM 05/28/2021    3:41 PM 12/12/2020    2:55 PM 08/29/2020    2:56 PM 05/28/2020    2:09 PM 05/20/2020    8:52 AM 11/26/2019   11:05 AM  Advanced Directives  Does Patient Have a Medical Advance Directive? Yes Yes Yes Yes Yes Yes Yes  Type of Advance Directive Living will Living will Living will Living will Living will Living will Living will  Does patient want to make changes to medical advance directive? No - Patient declined No - Patient declined No - Patient declined No - Patient declined No - Patient declined No - Patient declined No - Patient declined    Current Medications (verified) Outpatient Encounter Medications as of 06/10/2022  Medication Sig   atorvastatin (LIPITOR) 40 MG tablet TAKE 1 TABLET BY MOUTH EVERY DAY   Calcium Carb-Cholecalciferol (CALCIUM 1000 + D PO) Take 1 tablet by mouth daily.   calcium carbonate (TUMS - DOSED IN MG ELEMENTAL CALCIUM) 500 MG chewable tablet Chew 1 tablet by mouth daily.   cholecalciferol (VITAMIN D) 1000 units tablet Take 1,000 Units by mouth 2 (two) times daily. D3   COVID-19 mRNA vaccine 2023-2024 (COMIRNATY) syringe Inject into the muscle.   fluticasone (FLONASE) 50 MCG/ACT nasal spray Place 2 sprays into both nostrils as needed for allergies or rhinitis.   influenza vaccine adjuvanted (FLUAD QUADRIVALENT) 0.5 ML injection Inject into the muscle.   levothyroxine (SYNTHROID) 75 MCG tablet TAKE 1 TABLET (75 MCG TOTAL) BY MOUTH DAILY BEFORE BREAKFAST. E03.4   naproxen sodium (ANAPROX) 220 MG tablet Take 220 mg by mouth as needed.   omeprazole  (PRILOSEC) 20 MG capsule Take 20 mg by mouth daily. As needed   Polyethyl Glycol-Propyl Glycol (SYSTANE FREE OP) Apply 1-2 drops to eye 2 (two) times daily.   RSV vaccine recomb adjuvanted (AREXVY) 120 MCG/0.5ML injection Inject into the muscle.   solifenacin (VESICARE) 5 MG tablet TAKE 1 TABLET BY MOUTH EVERY DAY   No facility-administered encounter medications on file as of 06/10/2022.    Allergies (verified) Patient has no known allergies.   History: Past Medical History:  Diagnosis Date   Hearing loss    High cholesterol    Hypersomnia    Per Records from [facility]    Nocturia    Per [facility] from [facility]    Snoring    Per [facility] from [facility]    Thyroid disease    Past Surgical History:  Procedure Laterality Date   CATARACT EXTRACTION, BILATERAL  07/12/2014   CESAREAN SECTION     AGE 55   COLONOSCOPY     10 YEARS AGO AS OF 2018    EYE SURGERY  04/2020   Family History  Problem Relation Age of Onset   Osteoporosis Mother    Heart disease Father    Cancer Father    Heart failure Father    Heart attack Sister    Breast cancer Neg Hx    Social History   Socioeconomic History   Marital status: Married  Spouse name: Not on file   Number of children: Not on file   Years of education: Not on file   Highest education level: Not on file  Occupational History   Not on file  Tobacco Use   Smoking status: Former    Packs/day: 1.00    Years: 3.00    Total pack years: 3.00    Types: Cigarettes   Smokeless tobacco: Never   Tobacco comments:    Quit 50 years ago as of 2019   Vaping Use   Vaping Use: Never used  Substance and Sexual Activity   Alcohol use: Yes    Comment: 2 drinks weekly    Drug use: No   Sexual activity: Not Currently  Other Topics Concern   Not on file  Social History Narrative   Diet: No      Caffeine: Yes      Married, if yes what year: Yes, 1968      Do you live in a house, apartment, assisted  living, condo, trailer, ect: Museum/gallery curator, one stories, 2 persons       Pets: No       Current/Past profession: Charity fundraiser      Exercise: Yes, 3 times weekly, [employer]         Living Will: Yes   DNR: No   POA/HPOA: No      Questions below were not included in New Patient Packet      Functional Status:   Do you have difficulty bathing or dressing yourself?   Do you have difficulty preparing food or eating?   Do you have difficulty managing your medications?   Do you have difficulty managing your finances?   Do you have difficulty affording your medications?   Social Determinants of Health   Financial Resource Strain: Low Risk  (05/15/2018)   Overall Financial Resource Strain (CARDIA)    Difficulty of Paying Living Expenses: Not hard at all  Food Insecurity: No Food Insecurity (05/15/2018)   Hunger Vital Sign    Worried About Running Out of Food in the Last Year: Never true    Ran Out of Food in the Last Year: Never true  Transportation Needs: No Transportation Needs (05/15/2018)   PRAPARE - Administrator, Civil Service (Medical): No    Lack of Transportation (Non-Medical): No  Physical Activity: Insufficiently Active (05/15/2018)   Exercise Vital Sign    Days of Exercise per Week: 3 days    Minutes of Exercise per Session: 30 min  Stress: No Stress Concern Present (05/15/2018)   [employer] of Occupational Health - Occupational Stress Questionnaire    Feeling of Stress : Only a little  Social Connections: Socially Integrated (05/15/2018)   Social Connection and Isolation Panel [NHANES]    Frequency of Communication with Friends and Family: More than three times a week    Frequency of Social Gatherings with Friends and Family: More than three times a week    Attends Religious Services: More than 4 times per year    Active Member of [employer] or Organizations: Yes    Attends Engineer, structural: More than 4 times per year    Marital Status: Married    Tobacco  Counseling Counseling given: Not Answered Tobacco comments: Quit 50 years ago as of 2019    Clinical Intake:  Pre-visit preparation completed: Yes  Pain : No/denies pain     BMI - recorded: 32 Nutritional Status: BMI > 30  Obese Nutritional Risks:  None Diabetes: No  How often do you need to have someone help you when you read instructions, pamphlets, or other written materials from your doctor or pharmacy?: 1 - Never  Diabetic?no         Activities of Daily Living    06/10/2022    9:08 AM  In your present state of health, do you have any difficulty performing the following activities:  Hearing? 0  Vision? 0  Difficulty concentrating or making decisions? 0  Walking or climbing stairs? 0  Dressing or bathing? 0  Doing errands, shopping? 0  Preparing Food and eating ? N  Using the Toilet? N  In the past six months, have you accidently leaked urine? N  Do you have problems with loss of bowel control? N  Managing your Medications? N  Managing your Finances? N  Housekeeping or managing your Housekeeping? N    Patient Care Team: Sharon SellerEubanks, Brain Honeycutt K, NP as PCP - General (Geriatric Medicine) Serena Colonelosen, Jefry, MD as Consulting Physician (Otolaryngology)  Indicate any recent Medical Services you may have received from other than [facility] providers in the past year (date may be approximate).     Assessment:   This is a routine wellness examination for Ann Hamilton.  Hearing/Vision screen Hearing Screening - Comments:: No hearing concerns. Patient wears hearing aids.  Vision Screening - Comments:: No vision concerns. Patient next eye appointment is in 2 weeks.   Dietary issues and exercise activities discussed: Current Exercise Habits: Home exercise routine;Structured exercise class, Type of exercise: walking;Other - see comments;calisthenics, Time (Minutes): 45, Frequency (Times/Week): 5, Weekly Exercise (Minutes/Week): 225   Goals Addressed   None    Depression Screen     06/10/2022    8:44 AM 03/12/2022    9:08 AM 08/03/2021    3:49 PM 05/28/2021    3:38 PM 12/12/2020    2:54 PM 05/20/2020    8:49 AM 05/18/2019    8:14 AM  PHQ 2/9 Scores  PHQ - 2 Score 0 0 0 0 0 0 0    Fall Risk    06/10/2022    8:44 AM 03/12/2022    9:08 AM 08/03/2021    3:49 PM 05/28/2021    3:39 PM 12/12/2020    2:54 PM  Fall Risk   Falls in the past year? 0 0 0 0 0  Number falls in past yr: 0 0 0 0 0  Injury with Fall? 0 0 0 0 0  Risk for fall due to : No Fall Risks No Fall Risks No Fall Risks No Fall Risks   Follow up Falls evaluation completed Falls evaluation completed Falls evaluation completed Falls evaluation completed     FALL RISK PREVENTION PERTAINING TO THE HOME:  Any stairs in or around the home? No  If so, are there any without handrails? No  Home free of loose throw rugs in walkways, pet beds, electrical cords, etc? Yes  Adequate lighting in your home to reduce risk of falls? Yes   ASSISTIVE DEVICES UTILIZED TO PREVENT FALLS:  Life alert? No  Use of a cane, walker or w/c? No  Grab bars in the bathroom? Yes  Shower chair or bench in shower? Yes  Elevated toilet seat or a handicapped toilet? Yes   TIMED UP AND GO:  Was the test performed? No .    Cognitive Function:    05/15/2018    1:46 PM 05/05/2017   10:18 AM  MMSE - Mini Mental State Exam  Orientation to  time 4 5  Orientation to Place 5 5  Registration 3 3  Attention/ Calculation 5 5  Recall 3 3  Language- name 2 objects 2 2  Language- repeat 1 1  Language- follow 3 step command 3 3  Language- read & follow direction 1 1  Write a sentence 1 1  Copy design 1 1  Total score 29 30        06/10/2022    8:45 AM 05/28/2021    3:41 PM 05/20/2020    8:54 AM 05/18/2019    8:11 AM  6CIT Screen  What Year? 0 points 0 points 0 points 0 points  What month? 0 points 0 points 0 points 0 points  What time? 0 points 0 points 0 points 0 points  Count back from 20 0 points 0 points 0 points 0 points   Months in reverse 0 points 0 points 0 points 0 points  Repeat phrase 2 points 0 points 0 points 0 points  Total Score 2 points 0 points 0 points 0 points    Immunizations Immunization History  Administered Date(s) Administered   COVID-19, mRNA, vaccine(Comirnaty)12 years and older 05/03/2022   Fluad Quad(high Dose 65+) 04/27/2019, 04/10/2020, 03/23/2021, 03/02/2022   Influenza, High Dose Seasonal PF 03/30/2017, 04/27/2018   Influenza-Unspecified 04/11/2016   PFIZER Comirnaty(Gray Top)Covid-19 Tri-Sucrose Vaccine 12/23/2020   PFIZER(Purple Top)SARS-COV-2 Vaccination 08/17/2019, 09/11/2019, 04/17/2020   Pfizer Covid-19 Vaccine Bivalent Booster 37yrs & up 04/23/2021   Pneumococcal Conjugate-13 06/21/2014   Pneumococcal Polysaccharide-23 05/13/2010   Respiratory Syncytial Virus Vaccine,Recomb Aduvanted(Arexvy) 03/16/2022   Td 07/12/2009   Tdap 01/08/2009   Zoster Recombinat (Shingrix) 06/16/2019, 10/07/2019    TDAP status: Due, Education has been provided regarding the importance of this vaccine. Advised may receive this vaccine at local pharmacy or Health Dept. Aware to provide a copy of the vaccination record if obtained from local pharmacy or Health Dept. Verbalized acceptance and understanding.  Flu Vaccine status: Up to date  Pneumococcal vaccine status: Up to date  Covid-19 vaccine status: Information provided on how to obtain vaccines.   Qualifies for Shingles Vaccine? Yes   Zostavax completed No   Shingrix Completed?: Yes  Screening Tests Health Maintenance  Topic Date Due   COVID-19 Vaccine (6 - 2023-24 season) 03/12/2022   Medicare Annual Wellness (AWV)  06/11/2023   Pneumonia Vaccine 48+ Years old  Completed   INFLUENZA VACCINE  Completed   DEXA SCAN  Completed   Hepatitis C Screening  Completed   Zoster Vaccines- Shingrix  Completed   HPV VACCINES  Aged Out   DTaP/Tdap/Td  Discontinued   Fecal DNA (Cologuard)  Discontinued    Health Maintenance  Health  Maintenance Due  Topic Date Due   COVID-19 Vaccine (6 - 2023-24 season) 03/12/2022    Colorectal cancer screening: No longer required.   Mammogram status: No longer required due to age.  Bone Density status: Completed 2023. Results reflect: Bone density results: NORMAL. Repeat every 5 years.  Lung Cancer Screening: (Low Dose CT Chest recommended if Age 32-80 years, 30 pack-year currently smoking OR have quit w/in 15years.) does not qualify.   Lung Cancer Screening Referral: na  Additional Screening:  Hepatitis C Screening: does qualify; Completed   Vision Screening: Recommended annual ophthalmology exams for early detection of glaucoma and other disorders of the eye. Is the patient up to date with their annual eye exam?  Yes  Who is the provider or what is the name of the office in which  the patient attends annual eye exams? [facility]  If pt is not established with a provider, would they like to be referred to a provider to establish care? Yes .   Dental Screening: Recommended annual dental exams for proper oral hygiene  Community Resource Referral / Chronic Care Management: CRR required this visit?  No   CCM required this visit?  No      Plan:     I have personally reviewed and noted the following in the patient's chart:   Medical and social history Use of alcohol, tobacco or illicit drugs  Current medications and supplements including opioid prescriptions. Patient is not currently taking opioid prescriptions. Functional ability and status Nutritional status Physical activity Advanced directives List of other physicians Hospitalizations, surgeries, and ER visits in previous 12 months Vitals Screenings to include cognitive, depression, and falls Referrals and appointments  In addition, I have reviewed and discussed with patient certain preventive protocols, quality metrics, and best practice recommendations. A written personalized care plan for preventive services as  well as general preventive health recommendations were provided to patient.     Sharon Seller, NP   06/10/2022    Virtual Visit via Telephone Note  I connected with patient 06/10/22 at  8:40 AM EST by telephone and verified that I am speaking with the correct person using two identifiers.  Location: Patient: home Provider: [facility]   I discussed the limitations, risks, security and privacy concerns of performing an evaluation and management service by telephone and the availability of in person appointments. I also discussed with the patient that there may be a patient responsible charge related to this service. The patient expressed understanding and agreed to proceed.   I discussed the assessment and treatment plan with the patient. The patient was provided an opportunity to ask questions and all were answered. The patient agreed with the plan and demonstrated an understanding of the instructions.   The patient was advised to call back or seek an in-person evaluation if the symptoms worsen or if the condition fails to improve as anticipated.  I provided 14 minutes of non-face-to-face time during this encounter.  Janene Harvey. [facility] [employer] [facility] printed and mailed

## 2022-06-14 NOTE — Telephone Encounter (Signed)
PCC's will you please help with this? We already sent order for CPAP supplies. Thank you.

## 2022-06-14 NOTE — Telephone Encounter (Signed)
Pt states she spoke with [employer] an hour ago and they still do not have the signed order for her CPAP supplies. Please advise on the form. Patient is getting very frustrated she as needs her CPAP supplies.

## 2022-06-15 NOTE — Telephone Encounter (Signed)
I called [employer] & spoke to [place].  She states cmn was resent but I told her it is not on our log. She is going to resend cmn to my fax.

## 2022-06-16 NOTE — Telephone Encounter (Signed)
Fax not received - called [employer] & spoke to [place].  She manually faxed it to me & I received it.  I will give to [facility] to sign.  I called pt & left her vm explaining cmn was needed and I just received it and am giving to BW to sign.  Left her my direct # if she has any questions.  Nothing further needed.

## 2022-06-24 DIAGNOSIS — H26491 Other secondary cataract, right eye: Secondary | ICD-10-CM | POA: Diagnosis not present

## 2022-06-24 DIAGNOSIS — Z961 Presence of intraocular lens: Secondary | ICD-10-CM | POA: Diagnosis not present

## 2022-07-01 ENCOUNTER — Other Ambulatory Visit: Payer: Self-pay | Admitting: Nurse Practitioner

## 2022-07-07 DIAGNOSIS — H26491 Other secondary cataract, right eye: Secondary | ICD-10-CM | POA: Diagnosis not present

## 2022-08-03 ENCOUNTER — Other Ambulatory Visit: Payer: Self-pay | Admitting: Nurse Practitioner

## 2022-08-03 DIAGNOSIS — E034 Atrophy of thyroid (acquired): Secondary | ICD-10-CM

## 2022-09-10 ENCOUNTER — Ambulatory Visit: Payer: Medicare Other | Admitting: Nurse Practitioner

## 2022-10-25 DIAGNOSIS — S0003XA Contusion of scalp, initial encounter: Secondary | ICD-10-CM | POA: Diagnosis not present

## 2022-10-25 DIAGNOSIS — W19XXXA Unspecified fall, initial encounter: Secondary | ICD-10-CM | POA: Diagnosis not present

## 2022-10-25 DIAGNOSIS — S50311A Abrasion of right elbow, initial encounter: Secondary | ICD-10-CM | POA: Diagnosis not present

## 2022-10-25 DIAGNOSIS — Z743 Need for continuous supervision: Secondary | ICD-10-CM | POA: Diagnosis not present

## 2022-10-25 DIAGNOSIS — Z6831 Body mass index (BMI) 31.0-31.9, adult: Secondary | ICD-10-CM | POA: Diagnosis not present

## 2022-10-25 DIAGNOSIS — W1830XA Fall on same level, unspecified, initial encounter: Secondary | ICD-10-CM | POA: Diagnosis not present

## 2022-10-25 DIAGNOSIS — Z23 Encounter for immunization: Secondary | ICD-10-CM | POA: Diagnosis not present

## 2022-10-25 DIAGNOSIS — S59901A Unspecified injury of right elbow, initial encounter: Secondary | ICD-10-CM | POA: Diagnosis not present

## 2022-10-25 DIAGNOSIS — G501 Atypical facial pain: Secondary | ICD-10-CM | POA: Diagnosis not present

## 2022-10-25 DIAGNOSIS — M79601 Pain in right arm: Secondary | ICD-10-CM | POA: Diagnosis not present

## 2022-10-25 DIAGNOSIS — S0011XA Contusion of right eyelid and periocular area, initial encounter: Secondary | ICD-10-CM | POA: Diagnosis not present

## 2022-10-25 DIAGNOSIS — S60811A Abrasion of right wrist, initial encounter: Secondary | ICD-10-CM | POA: Diagnosis not present

## 2022-10-25 DIAGNOSIS — M47812 Spondylosis without myelopathy or radiculopathy, cervical region: Secondary | ICD-10-CM | POA: Diagnosis not present

## 2022-10-25 DIAGNOSIS — S0231XA Fracture of orbital floor, right side, initial encounter for closed fracture: Secondary | ICD-10-CM | POA: Diagnosis not present

## 2022-10-25 DIAGNOSIS — S50351A Superficial foreign body of right elbow, initial encounter: Secondary | ICD-10-CM | POA: Diagnosis not present

## 2022-10-25 DIAGNOSIS — M40202 Unspecified kyphosis, cervical region: Secondary | ICD-10-CM | POA: Diagnosis not present

## 2022-10-25 DIAGNOSIS — S0990XA Unspecified injury of head, initial encounter: Secondary | ICD-10-CM | POA: Diagnosis not present

## 2022-10-25 DIAGNOSIS — S0230XA Fracture of orbital floor, unspecified side, initial encounter for closed fracture: Secondary | ICD-10-CM | POA: Diagnosis not present

## 2022-10-25 DIAGNOSIS — E669 Obesity, unspecified: Secondary | ICD-10-CM | POA: Diagnosis not present

## 2022-10-25 DIAGNOSIS — S0993XA Unspecified injury of face, initial encounter: Secondary | ICD-10-CM | POA: Diagnosis not present

## 2022-10-25 DIAGNOSIS — H532 Diplopia: Secondary | ICD-10-CM | POA: Diagnosis not present

## 2022-10-25 DIAGNOSIS — M4802 Spinal stenosis, cervical region: Secondary | ICD-10-CM | POA: Diagnosis not present

## 2022-10-26 DIAGNOSIS — W01198A Fall on same level from slipping, tripping and stumbling with subsequent striking against other object, initial encounter: Secondary | ICD-10-CM | POA: Diagnosis not present

## 2022-10-26 DIAGNOSIS — H532 Diplopia: Secondary | ICD-10-CM | POA: Diagnosis not present

## 2022-10-26 DIAGNOSIS — E785 Hyperlipidemia, unspecified: Secondary | ICD-10-CM | POA: Diagnosis not present

## 2022-10-26 DIAGNOSIS — S0231XB Fracture of orbital floor, right side, initial encounter for open fracture: Secondary | ICD-10-CM | POA: Diagnosis not present

## 2022-10-26 DIAGNOSIS — E039 Hypothyroidism, unspecified: Secondary | ICD-10-CM | POA: Diagnosis not present

## 2022-10-26 DIAGNOSIS — Z6831 Body mass index (BMI) 31.0-31.9, adult: Secondary | ICD-10-CM | POA: Diagnosis not present

## 2022-10-26 DIAGNOSIS — H1131 Conjunctival hemorrhage, right eye: Secondary | ICD-10-CM | POA: Diagnosis not present

## 2022-10-26 DIAGNOSIS — J320 Chronic maxillary sinusitis: Secondary | ICD-10-CM | POA: Diagnosis not present

## 2022-10-26 DIAGNOSIS — R200 Anesthesia of skin: Secondary | ICD-10-CM | POA: Diagnosis not present

## 2022-10-26 DIAGNOSIS — S0231XA Fracture of orbital floor, right side, initial encounter for closed fracture: Secondary | ICD-10-CM | POA: Diagnosis not present

## 2022-10-26 DIAGNOSIS — R202 Paresthesia of skin: Secondary | ICD-10-CM | POA: Diagnosis not present

## 2022-10-26 DIAGNOSIS — S022XXA Fracture of nasal bones, initial encounter for closed fracture: Secondary | ICD-10-CM | POA: Diagnosis not present

## 2022-10-27 DIAGNOSIS — S0231XB Fracture of orbital floor, right side, initial encounter for open fracture: Secondary | ICD-10-CM | POA: Diagnosis not present

## 2022-11-02 DIAGNOSIS — S0231XA Fracture of orbital floor, right side, initial encounter for closed fracture: Secondary | ICD-10-CM | POA: Diagnosis not present

## 2022-11-05 ENCOUNTER — Ambulatory Visit (INDEPENDENT_AMBULATORY_CARE_PROVIDER_SITE_OTHER): Payer: Medicare Other | Admitting: Nurse Practitioner

## 2022-11-05 ENCOUNTER — Encounter: Payer: Self-pay | Admitting: Nurse Practitioner

## 2022-11-05 VITALS — BP 122/74 | HR 85 | Ht 62.0 in | Wt 182.5 lb

## 2022-11-05 DIAGNOSIS — E782 Mixed hyperlipidemia: Secondary | ICD-10-CM

## 2022-11-05 DIAGNOSIS — S0993XD Unspecified injury of face, subsequent encounter: Secondary | ICD-10-CM | POA: Diagnosis not present

## 2022-11-05 DIAGNOSIS — E034 Atrophy of thyroid (acquired): Secondary | ICD-10-CM | POA: Diagnosis not present

## 2022-11-05 DIAGNOSIS — M8580 Other specified disorders of bone density and structure, unspecified site: Secondary | ICD-10-CM | POA: Diagnosis not present

## 2022-11-05 NOTE — Progress Notes (Signed)
Careteam: Patient Care Team: Sharon Seller, NP as PCP - General (Geriatric Medicine) Serena Colonel, MD as Consulting Physician (Otolaryngology)  PLACE OF SERVICE:  [facility]  Advanced Directive information    No Known Allergies  Chief Complaint  Patient presents with   Medical Management of Chronic Issues    Patient presents today for a 7 month follow-up     HPI: Patient is a 77 y.o. female for routine follow up.   She went on vacation in [place] and tripped on 4/15, reports she felt  her head bounced on the concrete.  EMT came onto scene and took her to the hospital. She had 3 oribital fractures with a nose fracture. She was transferred to a trauma center and had facial reconstruction on 4/18.  Still has some blurred vision but this has improved, saw ophthalmologist yesterday.  Had decadron and antibiotics outpatient.   Blood pressure was very elevated during hospitalization but better today.   She needs to follow up with surgery but no one in the area will take her, she plans to go back to [place] for her post-op appt.  Maxillofacial surgery is hard to find locally and when she looked they would not bill medicare.   Reports when eye is tired she will get headache. Overall not having any severe pain/symptoms.  Sleeping well. Hydrocodone helping with pain.    Review of Systems:  Review of Systems  Constitutional:  Negative for chills, fever and weight loss.  HENT:  Negative for tinnitus.   Respiratory:  Negative for cough, sputum production and shortness of breath.   Cardiovascular:  Negative for chest pain, palpitations and leg swelling.  Gastrointestinal:  Negative for abdominal pain, constipation, diarrhea and heartburn.  Genitourinary:  Negative for dysuria, frequency and urgency.  Musculoskeletal:  Positive for falls. Negative for back pain, joint pain and myalgias.  Skin: Negative.   Neurological:  Positive for weakness and headaches. Negative for dizziness.   Psychiatric/Behavioral:  Negative for depression and memory loss. The patient does not have insomnia.     Past Medical History:  Diagnosis Date   Hearing loss    High cholesterol    Hypersomnia    Per Records from [facility]    Nocturia    Per Records from [facility]    Snoring    Per Records from [facility]    Thyroid disease    Past Surgical History:  Procedure Laterality Date   CATARACT EXTRACTION, BILATERAL  07/12/2014   CESAREAN SECTION     AGE 82   COLONOSCOPY     10 YEARS AGO AS OF 2018    EYE SURGERY  04/2020   Social History:   reports that she has quit smoking. Her smoking use included cigarettes. She has a 3.00 pack-year smoking history. She has never used smokeless tobacco. She reports current alcohol use. She reports that she does not use drugs.  Family History  Problem Relation Age of Onset   Osteoporosis Mother    Heart disease Father    Cancer Father    Heart failure Father    Heart attack Sister    Breast cancer Neg Hx     Medications: Patient's Medications  New Prescriptions   No medications on file  Previous Medications   AMOXICILLIN-CLAVULANATE (AUGMENTIN) 875-125 MG TABLET    Take 1 tablet by mouth 2 (two) times daily.   ATORVASTATIN (LIPITOR) 40 MG TABLET    TAKE 1 TABLET BY  MOUTH EVERY DAY   CALCIUM CARB-CHOLECALCIFEROL (CALCIUM 1000 + D PO)    Take 1 tablet by mouth daily.   CALCIUM CARBONATE (TUMS - DOSED IN MG ELEMENTAL CALCIUM) 500 MG CHEWABLE TABLET    Chew 1 tablet by mouth daily.   CHOLECALCIFEROL (VITAMIN D) 1000 UNITS TABLET    Take 1,000 Units by mouth 2 (two) times daily. D3   FLUTICASONE (FLONASE) 50 MCG/ACT NASAL SPRAY    Place 2 sprays into both nostrils as needed for allergies or rhinitis.   HYDROCODONE-ACETAMINOPHEN (NORCO) 7.5-325 MG TABLET    Take 1 tablet by mouth every 6 (six) hours as needed.   LEVOTHYROXINE (SYNTHROID) 75 MCG TABLET    TAKE 1 TABLET (75 MCG TOTAL) BY MOUTH DAILY BEFORE  BREAKFAST. E03.4   NAPROXEN SODIUM (ANAPROX) 220 MG TABLET    Take 220 mg by mouth as needed.   OMEPRAZOLE (PRILOSEC) 20 MG CAPSULE    Take 20 mg by mouth daily. As needed   POLYETHYL GLYCOL-PROPYL GLYCOL (SYSTANE FREE OP)    Apply 1-2 drops to eye 2 (two) times daily.   SOLIFENACIN (VESICARE) 5 MG TABLET    TAKE 1 TABLET BY MOUTH EVERY DAY  Modified Medications   No medications on file  Discontinued Medications   COVID-19 MRNA VACCINE 2023-2024 (COMIRNATY) SYRINGE    Inject into the muscle.   INFLUENZA VACCINE ADJUVANTED (FLUAD QUADRIVALENT) 0.5 ML INJECTION    Inject into the muscle.   RSV VACCINE RECOMB ADJUVANTED (AREXVY) 120 MCG/0.5ML INJECTION    Inject into the muscle.    Physical Exam:  Vitals:   11/05/22 0911  BP: 122/74  Pulse: 85  SpO2: 98%  Weight: 182 lb 8 oz (82.8 kg)  Height: 5\' 2"  (1.575 m)   Body mass index is 33.38 kg/m. Wt Readings from Last 3 Encounters:  11/05/22 182 lb 8 oz (82.8 kg)  05/13/22 180 lb 3.2 oz (81.7 kg)  03/12/22 179 lb (81.2 kg)    Physical Exam Constitutional:      General: She is not in acute distress.    Appearance: She is well-developed. She is not diaphoretic.  HENT:     Head: Normocephalic.     Nose: No congestion or rhinorrhea.     Mouth/Throat:     Pharynx: No oropharyngeal exudate.  Eyes:     Conjunctiva/sclera: Conjunctivae normal.     Pupils: Pupils are equal, round, and reactive to light.  Cardiovascular:     Rate and Rhythm: Normal rate and regular rhythm.     Heart sounds: Normal heart sounds.  Pulmonary:     Effort: Pulmonary effort is normal.     Breath sounds: Normal breath sounds.  Abdominal:     General: Bowel sounds are normal.     Palpations: Abdomen is soft.  Musculoskeletal:     Cervical back: Normal range of motion and neck supple.     Right lower leg: No edema.     Left lower leg: No edema.  Skin:    General: Skin is warm and dry.  Neurological:     Mental Status: She is alert.  Psychiatric:         Mood and Affect: Mood normal.     Labs reviewed: Basic Metabolic Panel: Recent Labs    03/10/22 0814  NA 142  K 5.0  CL 107  CO2 30  GLUCOSE 85  BUN 25  CREATININE 0.97  CALCIUM 9.2   Liver Function Tests: Recent Labs  03/10/22 0814  AST 18  ALT 16  BILITOT 0.5  PROT 6.6   No results for input(s): "LIPASE", "AMYLASE" in the last 8760 hours. No results for input(s): "AMMONIA" in the last 8760 hours. CBC: Recent Labs    03/10/22 0814  WBC 6.4  NEUTROABS 4,256  HGB 13.8  HCT 41.5  MCV 97.0  PLT 272   Lipid Panel: Recent Labs    03/10/22 0814  CHOL 177  HDL 53  LDLCALC 107*  TRIG 80  CHOLHDL 3.3   TSH: No results for input(s): "TSH" in the last 8760 hours. A1C: No results found for: "HGBA1C"   Assessment/Plan 1. Hypothyroidism due to acquired atrophy of thyroid -continues on synthroid, due for TSH follow up - TSH  2. Osteopenia, unspecified location -Recommended to take calcium 600 mg twice daily with Vitamin D 2000 units daily and weight bearing activity 30 mins/5 days a week - CBC with Differential/Platelet - Complete Metabolic Panel with eGFR  3. Mixed hyperlipidemia -continues on lipitor  - CBC with Differential/Platelet - Complete Metabolic Panel with eGFR  4. Facial injury, subsequent encounter -swelling/bruising healing and vision has improved. Continues with strict precautions.  Follow up with surgery planned in [place].  - CBC with Differential/Platelet - Complete Metabolic Panel with eGFR  Return in about 6 months (around 05/07/2023) for routine follow up.  Janene Harvey. Biagio Borg [facility] [phone]

## 2022-11-06 LAB — COMPLETE METABOLIC PANEL WITH GFR
AG Ratio: 1.5 (calc) (ref 1.0–2.5)
ALT: 18 U/L (ref 6–29)
AST: 18 U/L (ref 10–35)
Albumin: 3.5 g/dL — ABNORMAL LOW (ref 3.6–5.1)
Alkaline phosphatase (APISO): 62 U/L (ref 37–153)
BUN/Creatinine Ratio: 21 (calc) (ref 6–22)
BUN: 22 mg/dL (ref 7–25)
CO2: 32 mmol/L (ref 20–32)
Calcium: 9.4 mg/dL (ref 8.6–10.4)
Chloride: 104 mmol/L (ref 98–110)
Creat: 1.06 mg/dL — ABNORMAL HIGH (ref 0.60–1.00)
Globulin: 2.4 g/dL (calc) (ref 1.9–3.7)
Glucose, Bld: 89 mg/dL (ref 65–99)
Potassium: 4.6 mmol/L (ref 3.5–5.3)
Sodium: 142 mmol/L (ref 135–146)
Total Bilirubin: 0.5 mg/dL (ref 0.2–1.2)
Total Protein: 5.9 g/dL — ABNORMAL LOW (ref 6.1–8.1)
eGFR: 54 mL/min/{1.73_m2} — ABNORMAL LOW (ref >=60)

## 2022-11-06 LAB — CBC WITH DIFFERENTIAL/PLATELET
Absolute Monocytes: 1033 cells/uL — ABNORMAL HIGH (ref 200–950)
Basophils Absolute: 41 cells/uL (ref 0–200)
Basophils Relative: 0.5 %
Eosinophils Absolute: 328 cells/uL (ref 15–500)
Eosinophils Relative: 4 %
HCT: 38.4 % (ref 35.0–45.0)
Hemoglobin: 13 g/dL (ref 11.7–15.5)
Lymphs Abs: 1443 cells/uL (ref 850–3900)
MCH: 32.3 pg (ref 27.0–33.0)
MCHC: 33.9 g/dL (ref 32.0–36.0)
MCV: 95.3 fL (ref 80.0–100.0)
MPV: 11.1 fL (ref 7.5–12.5)
Monocytes Relative: 12.6 %
Neutro Abs: 5355 cells/uL (ref 1500–7800)
Neutrophils Relative %: 65.3 %
Platelets: 280 10*3/uL (ref 140–400)
RBC: 4.03 10*6/uL (ref 3.80–5.10)
RDW: 12.1 % (ref 11.0–15.0)
Total Lymphocyte: 17.6 %
WBC: 8.2 10*3/uL (ref 3.8–10.8)

## 2022-11-06 LAB — TSH: TSH: 0.91 mIU/L (ref 0.40–4.50)

## 2022-11-09 DIAGNOSIS — S0231XD Fracture of orbital floor, right side, subsequent encounter for fracture with routine healing: Secondary | ICD-10-CM | POA: Diagnosis not present

## 2022-11-23 ENCOUNTER — Other Ambulatory Visit: Payer: Self-pay | Admitting: Nurse Practitioner

## 2022-11-23 DIAGNOSIS — E782 Mixed hyperlipidemia: Secondary | ICD-10-CM

## 2022-12-25 ENCOUNTER — Other Ambulatory Visit: Payer: Self-pay | Admitting: Nurse Practitioner

## 2023-01-18 DIAGNOSIS — M25561 Pain in right knee: Secondary | ICD-10-CM | POA: Diagnosis not present

## 2023-01-18 DIAGNOSIS — M25562 Pain in left knee: Secondary | ICD-10-CM | POA: Diagnosis not present

## 2023-01-18 DIAGNOSIS — S83249A Other tear of medial meniscus, current injury, unspecified knee, initial encounter: Secondary | ICD-10-CM | POA: Diagnosis not present

## 2023-01-18 DIAGNOSIS — G8929 Other chronic pain: Secondary | ICD-10-CM | POA: Diagnosis not present

## 2023-01-27 ENCOUNTER — Other Ambulatory Visit: Payer: Self-pay | Admitting: Nurse Practitioner

## 2023-01-27 DIAGNOSIS — E034 Atrophy of thyroid (acquired): Secondary | ICD-10-CM

## 2023-01-31 DIAGNOSIS — M25561 Pain in right knee: Secondary | ICD-10-CM | POA: Diagnosis not present

## 2023-01-31 DIAGNOSIS — G8929 Other chronic pain: Secondary | ICD-10-CM | POA: Diagnosis not present

## 2023-01-31 DIAGNOSIS — S83249A Other tear of medial meniscus, current injury, unspecified knee, initial encounter: Secondary | ICD-10-CM | POA: Diagnosis not present

## 2023-01-31 DIAGNOSIS — S83281A Other tear of lateral meniscus, current injury, right knee, initial encounter: Secondary | ICD-10-CM | POA: Diagnosis not present

## 2023-01-31 DIAGNOSIS — M25562 Pain in left knee: Secondary | ICD-10-CM | POA: Diagnosis not present

## 2023-01-31 DIAGNOSIS — M25461 Effusion, right knee: Secondary | ICD-10-CM | POA: Diagnosis not present

## 2023-02-01 DIAGNOSIS — M170 Bilateral primary osteoarthritis of knee: Secondary | ICD-10-CM | POA: Diagnosis not present

## 2023-03-24 ENCOUNTER — Other Ambulatory Visit (HOSPITAL_BASED_OUTPATIENT_CLINIC_OR_DEPARTMENT_OTHER): Payer: Self-pay

## 2023-03-24 DIAGNOSIS — Z23 Encounter for immunization: Secondary | ICD-10-CM | POA: Diagnosis not present

## 2023-03-24 MED ORDER — COMIRNATY 30 MCG/0.3ML IM SUSY
0.3000 mL | PREFILLED_SYRINGE | Freq: Once | INTRAMUSCULAR | 0 refills | Status: AC
  Filled 2023-03-24: qty 0.3, 1d supply, fill #0

## 2023-03-24 MED ORDER — FLUAD 0.5 ML IM SUSY
0.5000 mL | PREFILLED_SYRINGE | Freq: Once | INTRAMUSCULAR | 0 refills | Status: AC
  Filled 2023-03-24: qty 0.5, 1d supply, fill #0

## 2023-05-09 ENCOUNTER — Encounter: Payer: Self-pay | Admitting: Nurse Practitioner

## 2023-05-09 ENCOUNTER — Other Ambulatory Visit: Payer: Self-pay

## 2023-05-09 ENCOUNTER — Ambulatory Visit (INDEPENDENT_AMBULATORY_CARE_PROVIDER_SITE_OTHER): Payer: Medicare Other | Admitting: Nurse Practitioner

## 2023-05-09 VITALS — BP 136/72 | HR 80 | Temp 97.1°F | Ht 63.0 in | Wt 184.4 lb

## 2023-05-09 DIAGNOSIS — E782 Mixed hyperlipidemia: Secondary | ICD-10-CM

## 2023-05-09 DIAGNOSIS — E034 Atrophy of thyroid (acquired): Secondary | ICD-10-CM

## 2023-05-09 DIAGNOSIS — M170 Bilateral primary osteoarthritis of knee: Secondary | ICD-10-CM | POA: Diagnosis not present

## 2023-05-09 DIAGNOSIS — N3281 Overactive bladder: Secondary | ICD-10-CM

## 2023-05-09 DIAGNOSIS — I10 Essential (primary) hypertension: Secondary | ICD-10-CM

## 2023-05-09 DIAGNOSIS — M8580 Other specified disorders of bone density and structure, unspecified site: Secondary | ICD-10-CM | POA: Diagnosis not present

## 2023-05-09 DIAGNOSIS — E669 Obesity, unspecified: Secondary | ICD-10-CM

## 2023-05-09 NOTE — Progress Notes (Signed)
Careteam: Patient Care Team: Sharon Seller, NP as PCP - General (Geriatric Medicine) Serena Colonel, MD as Consulting Physician (Otolaryngology)  PLACE OF SERVICE:  [facility]  Advanced Directive information Does Patient Have a Medical Advance Directive?: Yes, Type of Advance Directive: Living will, Does patient want to make changes to medical advance directive?: No - Patient declined  No Known Allergies  Chief Complaint  Patient presents with   Medical Management of Chronic Issues    6 month follow-up. Discuss need for AWV      HPI: Patient is a 77 y.o. female for routine follow up.   She has been very busy at church.   GERD- using omeprazole if she needs medication for GERD, sometimes she uses a few times a week. Overall controlled.   Osteoarthritis of bilateral knee- hx of mensis tear and she had a huge weekend with her granddaughter and wore heels.  She made appt with orthopedics  She did MRI and had bad arthritis in back on her knee.  Had bilateral knee injections Went on osteobiflex and uses brace and slowly increased activity but was able to rehab herself.  Uses to do zumba but now has modified this workout to be more 'knee' friendly.   TSH at goal in April.  Review of Systems:  Review of Systems  Constitutional:  Negative for chills, fever and weight loss.  HENT:  Negative for tinnitus.   Respiratory:  Negative for cough, sputum production and shortness of breath.   Cardiovascular:  Negative for chest pain, palpitations and leg swelling.  Gastrointestinal:  Negative for abdominal pain, constipation, diarrhea and heartburn.  Genitourinary:  Negative for dysuria, frequency and urgency.  Musculoskeletal:  Negative for back pain, falls, joint pain and myalgias.  Skin: Negative.   Neurological:  Negative for dizziness and headaches.  Psychiatric/Behavioral:  Negative for depression and memory loss. The patient does not have insomnia.     Past Medical  History:  Diagnosis Date   Hearing loss    High cholesterol    Hypersomnia    Per Records from [facility]    Nocturia    Per Records from [facility]    Snoring    Per Records from [facility]    Thyroid disease    Past Surgical History:  Procedure Laterality Date   CATARACT EXTRACTION, BILATERAL  07/12/2014   CESAREAN SECTION     AGE 44   COLONOSCOPY     10 YEARS AGO AS OF 2018    EYE SURGERY  04/2020   Social History:   reports that she has quit smoking. Her smoking use included cigarettes. She has a 3 pack-year smoking history. She has never used smokeless tobacco. She reports current alcohol use. She reports that she does not use drugs.  Family History  Problem Relation Age of Onset   Osteoporosis Mother    Heart disease Father    Cancer Father    Heart failure Father    Heart attack Sister    Breast cancer Neg Hx     Medications: Patient's Medications  New Prescriptions   No medications on file  Previous Medications   ATORVASTATIN (LIPITOR) 40 MG TABLET    TAKE 1 TABLET BY MOUTH EVERY DAY   CALCIUM CARB-CHOLECALCIFEROL (CALCIUM 1000 + D PO)    Take 1 tablet by mouth daily.   CALCIUM CARBONATE (OS-CAL) 1250 (500 CA) MG CHEWABLE TABLET    Chew by mouth.   CALCIUM  CARBONATE (TUMS - DOSED IN MG ELEMENTAL CALCIUM) 500 MG CHEWABLE TABLET    Chew 1 tablet by mouth daily.   CHOLECALCIFEROL (VITAMIN D) 1000 UNITS TABLET    Take 1,000 Units by mouth 2 (two) times daily. D3   FLUTICASONE (FLONASE) 50 MCG/ACT NASAL SPRAY    Place 2 sprays into both nostrils as needed for allergies or rhinitis.   LEVOTHYROXINE (SYNTHROID) 75 MCG TABLET    TAKE 1 TABLET (75 MCG TOTAL) BY MOUTH DAILY BEFORE BREAKFAST. E03.4   LOVASTATIN (ALTOPREV) 20 MG 24 HR TABLET    Take by mouth.   NAPROXEN SODIUM (ANAPROX) 220 MG TABLET    Take 220 mg by mouth as needed.   OMEPRAZOLE (PRILOSEC) 20 MG CAPSULE    Take 20 mg by mouth daily. As needed   POLYETHYL GLYCOL-PROPYL  GLYCOL (SYSTANE FREE OP)    Apply 1-2 drops to eye 2 (two) times daily.   PROPYLENE GLYCOL (SYSTANE BALANCE) 0.6 % SOLN    Apply to eye.   SOLIFENACIN (VESICARE) 5 MG TABLET    TAKE 1 TABLET BY MOUTH EVERY DAY  Modified Medications   No medications on file  Discontinued Medications   AMOXICILLIN-CLAVULANATE (AUGMENTIN) 875-125 MG TABLET    Take 1 tablet by mouth 2 (two) times daily.   HYDROCODONE-ACETAMINOPHEN (NORCO) 7.5-325 MG TABLET    Take 1 tablet by mouth every 6 (six) hours as needed.    Physical Exam:  Vitals:   05/09/23 0914  BP: 136/72  Pulse: 80  Temp: (!) 97.1 F (36.2 C)  TempSrc: Temporal  SpO2: 99%  Weight: 184 lb 6.4 oz (83.6 kg)  Height: 5\' 3"  (1.6 m)   Body mass index is 32.66 kg/m. Wt Readings from Last 3 Encounters:  05/09/23 184 lb 6.4 oz (83.6 kg)  11/05/22 182 lb 8 oz (82.8 kg)  05/13/22 180 lb 3.2 oz (81.7 kg)    Physical Exam Constitutional:      General: She is not in acute distress.    Appearance: She is well-developed. She is not diaphoretic.  HENT:     Head: Normocephalic and atraumatic.     Mouth/Throat:     Pharynx: No oropharyngeal exudate.  Eyes:     Conjunctiva/sclera: Conjunctivae normal.     Pupils: Pupils are equal, round, and reactive to light.  Cardiovascular:     Rate and Rhythm: Normal rate and regular rhythm.     Heart sounds: Normal heart sounds.  Pulmonary:     Effort: Pulmonary effort is normal.     Breath sounds: Normal breath sounds.  Abdominal:     General: Bowel sounds are normal.     Palpations: Abdomen is soft.  Musculoskeletal:     Cervical back: Normal range of motion and neck supple.     Right lower leg: No edema.     Left lower leg: No edema.  Skin:    General: Skin is warm and dry.  Neurological:     Mental Status: She is alert.  Psychiatric:        Mood and Affect: Mood normal.     Labs reviewed: Basic Metabolic Panel: Recent Labs    11/05/22 0950  NA 142  K 4.6  CL 104  CO2 32  GLUCOSE  89  BUN 22  CREATININE 1.06*  CALCIUM 9.4  TSH 0.91   Liver Function Tests: Recent Labs    11/05/22 0950  AST 18  ALT 18  BILITOT 0.5  PROT 5.9*  No results for input(s): "LIPASE", "AMYLASE" in the last 8760 hours. No results for input(s): "AMMONIA" in the last 8760 hours. CBC: Recent Labs    11/05/22 0950  WBC 8.2  NEUTROABS 5,355  HGB 13.0  HCT 38.4  MCV 95.3  PLT 280   Lipid Panel: No results for input(s): "CHOL", "HDL", "LDLCALC", "TRIG", "CHOLHDL", "LDLDIRECT" in the last 8760 hours. TSH: Recent Labs    11/05/22 0950  TSH 0.91   A1C: No results found for: "HGBA1C"   Assessment/Plan 1. Hypothyroidism due to acquired atrophy of thyroid TSH at goal on synthroid 75 mcg  2. Osteopenia, unspecified location -Recommended to take calcium 600 mg twice daily with Vitamin D 2000 units daily and weight bearing activity 30 mins/5 days a week  3. Mixed hyperlipidemia -will follow up lipids today Continues on lipitor 40 mg daily  4. Obesity (BMI 30-39.9) --education provided on healthy weight loss through increase in physical activity and proper nutrition   5. Primary osteoarthritis of both knees -improved at this time, continues to work on strength training and maintaining physical activity  6. Overactive bladder Stable on vesicare   Return in about 6 months (around 11/07/2023) for routine follow up. Janene Harvey. Biagio Borg [facility] [phone]

## 2023-05-09 NOTE — Patient Instructions (Signed)
[facility] eye care Address: [address] [place], [place] [address] [phone]   [facility] [place]- Dr Genia Del   Address: [address], [place], [place] [address] Phone: [phone]

## 2023-05-10 LAB — LIPID PANEL
Cholesterol: 182 mg/dL (ref <200)
HDL: 61 mg/dL (ref >=50)
LDL Cholesterol (Calc): 102 mg/dL — ABNORMAL HIGH
Non-HDL Cholesterol (Calc): 121 mg/dL (ref <130)
Total CHOL/HDL Ratio: 3 (calc) (ref <5.0)
Triglycerides: 95 mg/dL (ref <150)

## 2023-05-10 LAB — COMPREHENSIVE METABOLIC PANEL
AG Ratio: 1.7 (calc) (ref 1.0–2.5)
ALT: 15 U/L (ref 6–29)
AST: 18 U/L (ref 10–35)
Albumin: 4.1 g/dL (ref 3.6–5.1)
Alkaline phosphatase (APISO): 55 U/L (ref 37–153)
BUN: 20 mg/dL (ref 7–25)
CO2: 30 mmol/L (ref 20–32)
Calcium: 9.7 mg/dL (ref 8.6–10.4)
Chloride: 105 mmol/L (ref 98–110)
Creat: 0.87 mg/dL (ref 0.60–1.00)
Globulin: 2.4 g/dL (ref 1.9–3.7)
Glucose, Bld: 94 mg/dL (ref 65–139)
Potassium: 4.5 mmol/L (ref 3.5–5.3)
Sodium: 141 mmol/L (ref 135–146)
Total Bilirubin: 0.5 mg/dL (ref 0.2–1.2)
Total Protein: 6.5 g/dL (ref 6.1–8.1)

## 2023-05-10 LAB — CBC WITH DIFFERENTIAL/PLATELET
Absolute Lymphocytes: 1296 {cells}/uL (ref 850–3900)
Absolute Monocytes: 763 {cells}/uL (ref 200–950)
Basophils Absolute: 57 {cells}/uL (ref 0–200)
Basophils Relative: 0.7 %
Eosinophils Absolute: 189 {cells}/uL (ref 15–500)
Eosinophils Relative: 2.3 %
HCT: 41.5 % (ref 35.0–45.0)
Hemoglobin: 13.5 g/dL (ref 11.7–15.5)
MCH: 31.8 pg (ref 27.0–33.0)
MCHC: 32.5 g/dL (ref 32.0–36.0)
MCV: 97.9 fL (ref 80.0–100.0)
MPV: 11.2 fL (ref 7.5–12.5)
Monocytes Relative: 9.3 %
Neutro Abs: 5896 {cells}/uL (ref 1500–7800)
Neutrophils Relative %: 71.9 %
Platelets: 295 10*3/uL (ref 140–400)
RBC: 4.24 10*6/uL (ref 3.80–5.10)
RDW: 12.2 % (ref 11.0–15.0)
Total Lymphocyte: 15.8 %
WBC: 8.2 10*3/uL (ref 3.8–10.8)

## 2023-05-16 ENCOUNTER — Telehealth: Payer: Medicare Other | Admitting: Primary Care

## 2023-05-18 ENCOUNTER — Telehealth (INDEPENDENT_AMBULATORY_CARE_PROVIDER_SITE_OTHER): Payer: Medicare Other | Admitting: Primary Care

## 2023-05-18 ENCOUNTER — Other Ambulatory Visit: Payer: Self-pay | Admitting: Nurse Practitioner

## 2023-05-18 DIAGNOSIS — E782 Mixed hyperlipidemia: Secondary | ICD-10-CM

## 2023-05-18 DIAGNOSIS — G4733 Obstructive sleep apnea (adult) (pediatric): Secondary | ICD-10-CM

## 2023-05-18 NOTE — Progress Notes (Signed)
Virtual Visit via Video Note  I connected with Thailyn Khalid on 05/18/23 at  8:30 AM EST by a video enabled telemedicine application and verified that I am speaking with the correct person using two identifiers.  Location: Patient: Home Provider: OFFICE    I discussed the limitations of evaluation and management by telemedicine and the availability of in person appointments. The patient expressed understanding and agreed to proceed.  History of Present Illness: 77 year old female, former light smoker quit in 2019.  History significant for OSA on CPAP, hypothyroidism, osteopenia, mixed hyperlipidemia, obesity.  05/18/2023 Patient was contacted today for virtual video visit for CPAP compliance. She is doing well. She is 100% compliant with use > 4 hours over the last 30 days.  Average usage 8 hours 16 minutes. Pressure 11 cm H2O with average AHI 0.9. She is using full face mask. She had issues with our office getting medical supply order correctly sent [employer]   Observations/Objective:  Appears well without over respiratory symptoms   Assessment and Plan:  OSA Patient is 100% compliant with CPAP and reports benefit from use  Pressure 11cm h20; Residual AHI 0.1/hour No changes to plan of care, advised patient continue to wear CPAP nightly Renew CPAP supplies for 1 year with [employer]  Follow Up Instructions:  1 year with APP    I discussed the assessment and treatment plan with the patient. The patient was provided an opportunity to ask questions and all were answered. The patient agreed with the plan and demonstrated an understanding of the instructions.   The patient was advised to call back or seek an in-person evaluation if the symptoms worsen or if the condition fails to improve as anticipated.  I provided 22 minutes of non-face-to-face time during this encounter.   Glenford Bayley, NP

## 2023-05-18 NOTE — Patient Instructions (Signed)
[employer] download showed excellent compliance with use Sleep apnea is well-controlled on current pressure setting 11 cm H2O No changes. Continue to wear CPAP nightly for 4 to 6 hours or longer Make sure you are changing mask and filter monthly, tubing every 3 months, headgear and water chamber every 6 months Follow-up in 1 year or sooner

## 2023-06-18 ENCOUNTER — Other Ambulatory Visit: Payer: Self-pay | Admitting: Nurse Practitioner

## 2023-06-24 ENCOUNTER — Ambulatory Visit (INDEPENDENT_AMBULATORY_CARE_PROVIDER_SITE_OTHER): Payer: Medicare Other | Admitting: Nurse Practitioner

## 2023-06-24 DIAGNOSIS — Z0000 Encounter for general adult medical examination without abnormal findings: Secondary | ICD-10-CM

## 2023-06-24 NOTE — Patient Instructions (Signed)
  Ms. Tieken , Thank you for taking time to come for your Medicare Wellness Visit. I appreciate your ongoing commitment to your health goals. Please review the following plan we discussed and let me know if I can assist you in the future.     This is a list of the screening recommended for you and due dates:  Health Maintenance  Topic Date Due   Medicare Annual Wellness Visit  06/23/2024   DEXA scan (bone density measurement)  11/12/2026   DTaP/Tdap/Td vaccine (4 - Td or Tdap) 10/24/2032   Pneumonia Vaccine  Completed   Flu Shot  Completed   COVID-19 Vaccine  Completed   Hepatitis C Screening  Completed   Zoster (Shingles) Vaccine  Completed   HPV Vaccine  Aged Out   Cologuard (Stool DNA test)  Discontinued

## 2023-06-24 NOTE — Progress Notes (Signed)
  This service is provided via telemedicine  No vital signs collected/recorded due to the encounter was a telemedicine visit.   Location of patient (ex: home, work):  Home  Patient consents to a telephone visit: Yes  Location of the provider (ex: office, home):  [facility], Office   Name of any referring provider:  N/A  Names of all persons participating in the telemedicine service and their role in the encounter:  S.Chrae B/CMA, [name], NP, and Patient   Time spent on call:  6 min with medical assistant

## 2023-06-24 NOTE — Progress Notes (Signed)
Subjective:   Ann Hamilton is a 77 y.o. female who presents for Medicare Annual (Subsequent) preventive examination.  Visit Complete: Virtual I connected with  Marshella Venezia on 06/24/23 by a video and audio enabled telemedicine application and verified that I am speaking with the correct person using two identifiers.  Patient Location: Home  Provider Location: Office/Clinic  I discussed the limitations of evaluation and management by telemedicine. The patient expressed understanding and agreed to proceed.  Vital Signs: Because this visit was a virtual/telehealth visit, some criteria may be missing or patient reported. Any vitals not documented were not able to be obtained and vitals that have been documented are patient reported.   Cardiac Risk Factors include: advanced age (>68men, >76 women);dyslipidemia;obesity (BMI >30kg/m2)     Objective:    There were no vitals filed for this visit. There is no height or weight on file to calculate BMI.     06/24/2023    1:49 PM 05/09/2023    8:09 AM 03/12/2022    9:08 AM 05/28/2021    3:41 PM 12/12/2020    2:55 PM 08/29/2020    2:56 PM 05/28/2020    2:09 PM  Advanced Directives  Does Patient Have a Medical Advance Directive? Yes Yes Yes Yes Yes Yes Yes  Type of Advance Directive Living will Living will Living will Living will Living will Living will Living will  Does patient want to make changes to medical advance directive? No - Patient declined No - Patient declined No - Patient declined No - Patient declined No - Patient declined No - Patient declined No - Patient declined    Current Medications (verified) Outpatient Encounter Medications as of 06/24/2023  Medication Sig   atorvastatin (LIPITOR) 40 MG tablet TAKE 1 TABLET BY MOUTH EVERY DAY   Boswellia-Glucosamine-Vit D (OSTEO BI-FLEX ONE PER DAY PO) Take 1 tablet by mouth daily.   Calcium Carb-Cholecalciferol (CALCIUM 1000 + D PO) Take 1 tablet by mouth  daily.   calcium carbonate (OS-CAL) 1250 (500 Ca) MG chewable tablet Chew by mouth.   calcium carbonate (TUMS - DOSED IN MG ELEMENTAL CALCIUM) 500 MG chewable tablet Chew 1 tablet by mouth daily.   cholecalciferol (VITAMIN D) 1000 units tablet Take 1,000 Units by mouth 2 (two) times daily. D3   fluticasone (FLONASE) 50 MCG/ACT nasal spray Place 2 sprays into both nostrils as needed for allergies or rhinitis.   levothyroxine (SYNTHROID) 75 MCG tablet TAKE 1 TABLET (75 MCG TOTAL) BY MOUTH DAILY BEFORE BREAKFAST. E03.4   naproxen sodium (ANAPROX) 220 MG tablet Take 220 mg by mouth as needed.   omeprazole (PRILOSEC) 20 MG capsule Take 20 mg by mouth daily. As needed   Polyethyl Glycol-Propyl Glycol (SYSTANE FREE OP) Apply 1-2 drops to eye 2 (two) times daily.   Propylene Glycol (SYSTANE BALANCE) 0.6 % SOLN Apply to eye.   solifenacin (VESICARE) 5 MG tablet TAKE 1 TABLET BY MOUTH EVERY DAY   No facility-administered encounter medications on file as of 06/24/2023.    Allergies (verified) Patient has no known allergies.   History: Past Medical History:  Diagnosis Date   Hearing loss    High cholesterol    Hypersomnia    Per Records from [facility]    Nocturia    Per Records from [facility]    Snoring    Per Records from [facility]    Thyroid disease    Past Surgical History:  Procedure Laterality Date  CATARACT EXTRACTION, BILATERAL  07/12/2014   CESAREAN SECTION     AGE 71   COLONOSCOPY     10 YEARS AGO AS OF 2018    EYE SURGERY  04/2020   Family History  Problem Relation Age of Onset   Osteoporosis Mother    Heart disease Father    Cancer Father    Heart failure Father    Heart attack Sister    Breast cancer Neg Hx    Social History   Socioeconomic History   Marital status: Married    Spouse name: Not on file   Number of children: Not on file   Years of education: Not on file   Highest education level: Associate degree: occupational,  Scientist, product/process development, or vocational program  Occupational History   Not on file  Tobacco Use   Smoking status: Former    Current packs/day: 1.00    Average packs/day: 1 pack/day for 3.0 years (3.0 ttl pk-yrs)    Types: Cigarettes   Smokeless tobacco: Never   Tobacco comments:    Quit 50 years ago as of 2019   Vaping Use   Vaping status: Never Used  Substance and Sexual Activity   Alcohol use: Yes    Comment: 2 drinks weekly    Drug use: No   Sexual activity: Not Currently  Other Topics Concern   Not on file  Social History Narrative   Diet: No      Caffeine: Yes      Married, if yes what year: Yes, 1968      Do you live in a house, apartment, assisted living, condo, trailer, ect: Museum/gallery curator, one stories, 2 persons       Pets: No       Current/Past profession: Charity fundraiser      Exercise: Yes, 3 times weekly, [employer]         Living Will: Yes   DNR: No   POA/HPOA: No      Questions below were not included in New Patient Packet      Functional Status:   Do you have difficulty bathing or dressing yourself?   Do you have difficulty preparing food or eating?   Do you have difficulty managing your medications?   Do you have difficulty managing your finances?   Do you have difficulty affording your medications?   Social Drivers of Corporate investment banker Strain: Low Risk  (05/05/2023)   Overall Financial Resource Strain (CARDIA)    Difficulty of Paying Living Expenses: Not hard at all  Food Insecurity: No Food Insecurity (05/05/2023)   Hunger Vital Sign    Worried About Running Out of Food in the Last Year: Never true    Ran Out of Food in the Last Year: Never true  Transportation Needs: No Transportation Needs (05/05/2023)   PRAPARE - Administrator, Civil Service (Medical): No    Lack of Transportation (Non-Medical): No  Physical Activity: Sufficiently Active (05/05/2023)   Exercise Vital Sign    Days of Exercise per Week: 5 days    Minutes of Exercise per Session: 50  min  Stress: No Stress Concern Present (05/05/2023)   [employer] of Occupational Health - Occupational Stress Questionnaire    Feeling of Stress : Only a little  Social Connections: Socially Integrated (05/05/2023)   Social Connection and Isolation Panel [NHANES]    Frequency of Communication with Friends and Family: More than three times a week    Frequency of Social Gatherings  with Friends and Family: Three times a week    Attends Religious Services: More than 4 times per year    Active Member of Clubs or Organizations: Yes    Attends Engineer, structural: More than 4 times per year    Marital Status: Married    Tobacco Counseling Counseling given: Not Answered Tobacco comments: Quit 50 years ago as of 2019    Clinical Intake:  Pre-visit preparation completed: Yes  Pain : No/denies pain     BMI - recorded: 32 Nutritional Status: BMI > 30  Obese Nutritional Risks: None Diabetes: No  How often do you need to have someone help you when you read instructions, pamphlets, or other written materials from your doctor or pharmacy?: 1 - Never         Activities of Daily Living    06/24/2023    1:57 PM 06/23/2023    4:41 PM  In your present state of health, do you have any difficulty performing the following activities:  Hearing? 1 1   Vision? 0 0   Difficulty concentrating or making decisions? 0 0   Walking or climbing stairs? 0 0   Dressing or bathing? 0 0   Doing errands, shopping? 0 0   Preparing Food and eating ? N N   Using the Toilet? N N   In the past six months, have you accidently leaked urine? N N   Do you have problems with loss of bowel control? N N   Managing your Medications? N N   Managing your Finances? N N   Housekeeping or managing your Housekeeping? N N      Patient-reported    Patient Care Team: Sharon Seller, NP as PCP - General (Geriatric Medicine) Serena Colonel, MD as Consulting Physician (Otolaryngology)  Indicate  any recent Medical Services you may have received from other than [facility] providers in the past year (date may be approximate).     Assessment:   This is a routine wellness examination for Harlym.  Hearing/Vision screen Hearing Screening - Comments:: Wears hearing aids that are beneficial  Vision Screening - Comments:: Last eye exam was December 2023, Dr.Shapiro retired. Pending eye exam in June 2025, Dr.Groat   Goals Addressed   None    Depression Screen    06/24/2023    1:47 PM 06/10/2022    8:44 AM 03/12/2022    9:08 AM 08/03/2021    3:49 PM 05/28/2021    3:38 PM 12/12/2020    2:54 PM 05/20/2020    8:49 AM  PHQ 2/9 Scores  PHQ - 2 Score 0 0 0 0 0 0 0    Fall Risk    06/24/2023    1:47 PM 06/23/2023    4:41 PM 11/05/2022    9:16 AM 06/10/2022    8:44 AM 03/12/2022    9:08 AM  Fall Risk   Falls in the past year? 1 1  1  0 0  Number falls in past yr: 0 0  0 0 0  Injury with Fall? 1 1  1  0 0  Risk for fall due to : History of fall(s)  No Fall Risks No Fall Risks No Fall Risks  Follow up Falls evaluation completed  Falls evaluation completed Falls evaluation completed Falls evaluation completed     Patient-reported    MEDICARE RISK AT HOME: Medicare Risk at Home Any stairs in or around the home?: Yes If so, are there any without handrails?: No Home free  of loose throw rugs in walkways, pet beds, electrical cords, etc?: Yes Adequate lighting in your home to reduce risk of falls?: Yes Life alert?: No Use of a cane, walker or w/c?: No Grab bars in the bathroom?: Yes Shower chair or bench in shower?: Yes Elevated toilet seat or a handicapped toilet?: Yes  TIMED UP AND GO:  Was the test performed?  No    Cognitive Function:    05/15/2018    1:46 PM 05/05/2017   10:18 AM  MMSE - Mini Mental State Exam  Orientation to time 4 5  Orientation to Place 5 5  Registration 3 3  Attention/ Calculation 5 5  Recall 3 3  Language- name 2 objects 2 2  Language- repeat 1 1   Language- follow 3 step command 3 3  Language- read & follow direction 1 1  Write a sentence 1 1  Copy design 1 1  Total score 29 30        06/24/2023    1:49 PM 06/10/2022    8:45 AM 05/28/2021    3:41 PM 05/20/2020    8:54 AM 05/18/2019    8:11 AM  6CIT Screen  What Year? 0 points 0 points 0 points 0 points 0 points  What month? 0 points 0 points 0 points 0 points 0 points  What time? 0 points 0 points 0 points 0 points 0 points  Count back from 20 0 points 0 points 0 points 0 points 0 points  Months in reverse 0 points 0 points 0 points 0 points 0 points  Repeat phrase 0 points 2 points 0 points 0 points 0 points  Total Score 0 points 2 points 0 points 0 points 0 points    Immunizations Immunization History  Administered Date(s) Administered   Fluad Quad(high Dose 65+) 04/27/2019, 04/10/2020, 03/23/2021, 03/02/2022   Fluad Trivalent(High Dose 65+) 03/24/2023   Influenza, High Dose Seasonal PF 03/30/2017, 04/27/2018   Influenza-Unspecified 04/11/2016   PFIZER Comirnaty(Gray Top)Covid-19 Tri-Sucrose Vaccine 12/23/2020   PFIZER(Purple Top)SARS-COV-2 Vaccination 08/17/2019, 09/11/2019, 04/17/2020   Pfizer Covid-19 Vaccine Bivalent Booster 22yrs & up 04/23/2021   Pfizer(Comirnaty)Fall Seasonal Vaccine 12 years and older 05/03/2022, 03/24/2023   Pneumococcal Conjugate-13 06/21/2014   Pneumococcal Polysaccharide-23 05/13/2010   Respiratory Syncytial Virus Vaccine,Recomb Aduvanted(Arexvy) 03/16/2022   Td 07/12/2009   Tdap 01/08/2009, 10/25/2022   Zoster Recombinant(Shingrix) 06/16/2019, 10/07/2019    TDAP status: Up to date  Flu Vaccine status: Up to date  Pneumococcal vaccine status: Up to date  Covid-19 vaccine status: Information provided on how to obtain vaccines.   Qualifies for Shingles Vaccine? Yes   Zostavax completed No   Shingrix Completed?: Yes  Screening Tests Health Maintenance  Topic Date Due   Medicare Annual Wellness (AWV)  06/23/2024   DEXA SCAN   11/12/2026   DTaP/Tdap/Td (4 - Td or Tdap) 10/24/2032   Pneumonia Vaccine 47+ Years old  Completed   INFLUENZA VACCINE  Completed   COVID-19 Vaccine  Completed   Hepatitis C Screening  Completed   Zoster Vaccines- Shingrix  Completed   HPV VACCINES  Aged Out   Fecal DNA (Cologuard)  Discontinued    Health Maintenance  There are no preventive care reminders to display for this patient.   Colorectal cancer screening: No longer required.   Mammogram status: No longer required due to age.  Bone Density status: Completed 2023. Results reflect: Bone density results: NORMAL. Repeat every 5 years.  Lung Cancer Screening: (Low Dose CT Chest recommended  if Age 19-80 years, 20 pack-year currently smoking OR have quit w/in 15years.) does not qualify.   Lung Cancer Screening Referral: na  Additional Screening:  Hepatitis C Screening: does qualify; Completed   Vision Screening: Recommended annual ophthalmology exams for early detection of glaucoma and other disorders of the eye. Is the patient up to date with their annual eye exam?  Yes  Who is the provider or what is the name of the office in which the patient attends annual eye exams? [facility] If pt is not established with a provider, would they like to be referred to a provider to establish care? No .   Dental Screening: Recommended annual dental exams for proper oral hygiene   Community Resource Referral / Chronic Care Management: CRR required this visit?  No   CCM required this visit?  No     Plan:     I have personally reviewed and noted the following in the patient's chart:   Medical and social history Use of alcohol, tobacco or illicit drugs  Current medications and supplements including opioid prescriptions. Patient is not currently taking opioid prescriptions. Functional ability and status Nutritional status Physical activity Advanced directives List of other physicians Hospitalizations, surgeries, and ER visits in  previous 12 months Vitals Screenings to include cognitive, depression, and falls Referrals and appointments  In addition, I have reviewed and discussed with patient certain preventive protocols, quality metrics, and best practice recommendations. A written personalized care plan for preventive services as well as general preventive health recommendations were provided to patient.     Sharon Seller, NP   06/24/2023   After Visit Summary: (MyChart) Due to this being a telephonic visit, the after visit summary with patients personalized plan was offered to patient via MyChart

## 2023-07-25 ENCOUNTER — Other Ambulatory Visit: Payer: Self-pay | Admitting: Nurse Practitioner

## 2023-07-25 DIAGNOSIS — E034 Atrophy of thyroid (acquired): Secondary | ICD-10-CM

## 2023-10-18 ENCOUNTER — Other Ambulatory Visit: Payer: Self-pay | Admitting: Nurse Practitioner

## 2023-10-18 DIAGNOSIS — E034 Atrophy of thyroid (acquired): Secondary | ICD-10-CM

## 2023-10-18 MED ORDER — LEVOTHYROXINE SODIUM 75 MCG PO TABS: 75.0000 ug | ORAL_TABLET | Freq: Every day | ORAL | 1 refills | Status: DC

## 2023-10-18 NOTE — Telephone Encounter (Signed)
 Last Fill: 07/25/23 90 tabs/ 1 RF  Pt reports [facility] called [facility] this AM and pharmacy reports they need updated refill to fill for patient. No scripts on file.  Routing to provider for review/authorization.

## 2023-10-18 NOTE — Telephone Encounter (Signed)
 Copied from CRM [id number]. Topic: Clinical - Medication Refill >> Oct 18, 2023 11:07 AM Philippa Chester F wrote: Most Recent Primary Care Visit:  Provider: Sharon Seller  Department: [facility]  Visit Type: MEDICARE AWV, SEQUENTIAL  Date: 06/24/2023  Medication: levothyroxine (SYNTHROID) 75 MCG tablet  Has the patient contacted their pharmacy? Yes   Is this the correct pharmacy for this prescription? Yes  This is the patient's preferred pharmacy:  [id number] - [place], [place] - [address] [facility] [address] [place] [address] Phone: [phone] Fax: [phone]   Has the prescription been filled recently? No  Is the patient out of the medication? Yes  Has the patient been seen for an appointment in the last year OR does the patient have an upcoming appointment? Yes  Can we respond through MyChart? Yes  Agent: Please be advised that Rx refills may take up to 3 business days. We ask that you follow-up with your pharmacy.

## 2023-11-07 ENCOUNTER — Ambulatory Visit: Payer: Medicare Other | Admitting: Nurse Practitioner

## 2023-11-09 ENCOUNTER — Other Ambulatory Visit: Payer: Self-pay | Admitting: Nurse Practitioner

## 2023-11-09 DIAGNOSIS — E782 Mixed hyperlipidemia: Secondary | ICD-10-CM

## 2023-11-14 ENCOUNTER — Ambulatory Visit (INDEPENDENT_AMBULATORY_CARE_PROVIDER_SITE_OTHER): Admitting: Nurse Practitioner

## 2023-11-14 ENCOUNTER — Encounter: Payer: Self-pay | Admitting: Nurse Practitioner

## 2023-11-14 VITALS — BP 126/78 | HR 88 | Temp 97.8°F | Resp 18 | Ht 63.0 in | Wt 175.4 lb

## 2023-11-14 DIAGNOSIS — E034 Atrophy of thyroid (acquired): Secondary | ICD-10-CM

## 2023-11-14 DIAGNOSIS — I10 Essential (primary) hypertension: Secondary | ICD-10-CM | POA: Insufficient documentation

## 2023-11-14 DIAGNOSIS — G4733 Obstructive sleep apnea (adult) (pediatric): Secondary | ICD-10-CM | POA: Diagnosis not present

## 2023-11-14 DIAGNOSIS — E782 Mixed hyperlipidemia: Secondary | ICD-10-CM

## 2023-11-14 DIAGNOSIS — N3281 Overactive bladder: Secondary | ICD-10-CM | POA: Insufficient documentation

## 2023-11-14 DIAGNOSIS — E669 Obesity, unspecified: Secondary | ICD-10-CM

## 2023-11-14 DIAGNOSIS — M170 Bilateral primary osteoarthritis of knee: Secondary | ICD-10-CM | POA: Diagnosis not present

## 2023-11-14 DIAGNOSIS — M8580 Other specified disorders of bone density and structure, unspecified site: Secondary | ICD-10-CM

## 2023-11-14 NOTE — Progress Notes (Signed)
 Careteam: Patient Care Team: Verma Gobble, NP as PCP - General (Geriatric Medicine) Janita Mellow, MD as Consulting Physician (Otolaryngology)  PLACE OF SERVICE:  [facility]  Advanced Directive information Does Patient Have a Medical Advance Directive?: Yes, Type of Advance Directive: Healthcare Power of [place];Living will, Does patient want to make changes to medical advance directive?: No - Patient declined  No Known Allergies  Chief Complaint  Patient presents with   Medical Management of Chronic Issues    6 month follow up.    HPI:  Discussed the use of AI scribe software for clinical note transcription with the patient, who gave verbal consent to proceed.  History of Present Illness Ann Hamilton is a 78 year old female who presents for a six-month follow-up visit.  She experiences knee pain, managed with Aleve taken in the morning and at night. The pain sometimes disrupts her sleep but improves with movement in the morning. She has increased her Zumba classes to twice a week and is cautious with her footwear and exercise environment to prevent exacerbation. She has difficulty sleeping on one side due to pain, which resolves upon movement.  She reports a recent weight loss of ten pounds, attributed to joining [employer] and increased physical activity. Her acid reflux has improved with the weight loss. No chest pain, palpitations, shortness of breath, or gastrointestinal bleeding.  She is currently taking medication for her bladder, obtained from the pharmacy, and mentions receiving a letter about alternative medications that are cheaper but have undesirable side effects.   She takes her thyroid  medication early in the morning, around 5 AM, along with Aleve, to manage her knee pain.  She is socially active, participating in events such as game nights, senior lunches, and community activities. She lives in [place], which is close to her church,  allowing her to attend various events. She is planning a trip to [place]  with her family in July and mentions her husband's health is not as good as hers, requiring the use of a cane. Her grandson is attentive to her husband's needs during family outings.    Review of Systems:  Review of Systems  Constitutional:  Negative for chills, fever and weight loss.  HENT:  Positive for hearing loss. Negative for tinnitus.   Respiratory:  Negative for cough, sputum production and shortness of breath.   Cardiovascular:  Negative for chest pain, palpitations and leg swelling.  Gastrointestinal:  Negative for abdominal pain, constipation, diarrhea and heartburn.  Genitourinary:  Negative for dysuria, frequency and urgency.  Musculoskeletal:  Negative for back pain, falls, joint pain and myalgias.  Skin: Negative.   Neurological:  Negative for dizziness and headaches.  Psychiatric/Behavioral:  Negative for depression and memory loss. The patient does not have insomnia.     Past Medical History:  Diagnosis Date   Hearing loss    High cholesterol    Hypersomnia    Per Records from [facility]    Nocturia    Per Records from [facility]    Snoring    Per Records from [facility]    Thyroid  disease    Past Surgical History:  Procedure Laterality Date   CATARACT EXTRACTION, BILATERAL  07/12/2014   CESAREAN SECTION     AGE 78   COLONOSCOPY     10 YEARS AGO AS OF 2018    EYE SURGERY  04/2020   Social History:   reports that she has quit smoking.  Her smoking use included cigarettes. She has a 3 pack-year smoking history. She has never used smokeless tobacco. She reports current alcohol use. She reports that she does not use drugs.  Family History  Problem Relation Age of Onset   Osteoporosis Mother    Heart disease Father    Cancer Father    Heart failure Father    Heart attack Sister    Breast cancer Neg Hx     Medications: Patient's Medications  New  Prescriptions   No medications on file  Previous Medications   ATORVASTATIN  (LIPITOR) 40 MG TABLET    TAKE 1 TABLET BY MOUTH EVERY DAY   BOSWELLIA-GLUCOSAMINE-VIT D (OSTEO BI-FLEX ONE PER DAY PO)    Take 1 tablet by mouth daily.   CALCIUM  CARB-CHOLECALCIFEROL (CALCIUM  1000 + D PO)    Take 1 tablet by mouth daily.   CALCIUM  CARBONATE (OS-CAL) 1250 (500 CA) MG CHEWABLE TABLET    Chew by mouth.   CALCIUM  CARBONATE (TUMS - DOSED IN MG ELEMENTAL CALCIUM ) 500 MG CHEWABLE TABLET    Chew 1 tablet by mouth daily.   CHOLECALCIFEROL (VITAMIN D) 1000 UNITS TABLET    Take 1,000 Units by mouth 2 (two) times daily. D3   FLUTICASONE  (FLONASE ) 50 MCG/ACT NASAL SPRAY    Place 2 sprays into both nostrils as needed for allergies or rhinitis.   LEVOTHYROXINE  (SYNTHROID ) 75 MCG TABLET    Take 1 tablet (75 mcg total) by mouth daily before breakfast. E03.4   NAPROXEN SODIUM (ANAPROX) 220 MG TABLET    Take 220 mg by mouth as needed.   OMEPRAZOLE (PRILOSEC) 20 MG CAPSULE    Take 20 mg by mouth daily. As needed   POLYETHYL GLYCOL-PROPYL GLYCOL (SYSTANE FREE OP)    Apply 1-2 drops to eye 2 (two) times daily.   PROPYLENE GLYCOL (SYSTANE BALANCE) 0.6 % SOLN    Apply to eye.   SOLIFENACIN  (VESICARE ) 5 MG TABLET    TAKE 1 TABLET BY MOUTH EVERY DAY  Modified Medications   No medications on file  Discontinued Medications   No medications on file    Physical Exam:  Vitals:   11/14/23 0839  BP: 126/78  Pulse: 88  Resp: 18  Temp: 97.8 F (36.6 C)  SpO2: 98%  Weight: 175 lb 6.4 oz (79.6 kg)  Height: 5\' 3"  (1.6 m)   Body mass index is 31.07 kg/m. Wt Readings from Last 3 Encounters:  11/14/23 175 lb 6.4 oz (79.6 kg)  05/09/23 184 lb 6.4 oz (83.6 kg)  11/05/22 182 lb 8 oz (82.8 kg)    Physical Exam Constitutional:      General: She is not in acute distress.    Appearance: She is well-developed. She is not diaphoretic.  HENT:     Head: Normocephalic and atraumatic.     Mouth/Throat:     Pharynx: No  oropharyngeal exudate.  Eyes:     Conjunctiva/sclera: Conjunctivae normal.     Pupils: Pupils are equal, round, and reactive to light.  Cardiovascular:     Rate and Rhythm: Normal rate and regular rhythm.     Heart sounds: Normal heart sounds.  Pulmonary:     Effort: Pulmonary effort is normal.     Breath sounds: Normal breath sounds.  Abdominal:     General: Bowel sounds are normal.     Palpations: Abdomen is soft.  Musculoskeletal:     Cervical back: Normal range of motion and neck supple.     Right lower  leg: No edema.     Left lower leg: No edema.  Skin:    General: Skin is warm and dry.  Neurological:     Mental Status: She is alert.  Psychiatric:        Mood and Affect: Mood normal.     Labs reviewed: Basic Metabolic Panel: Recent Labs    05/09/23 0938  NA 141  K 4.5  CL 105  CO2 30  GLUCOSE 94  BUN 20  CREATININE 0.87  CALCIUM  9.7   Liver Function Tests: Recent Labs    05/09/23 0938  AST 18  ALT 15  BILITOT 0.5  PROT 6.5   No results for input(s): "LIPASE", "AMYLASE" in the last 8760 hours. No results for input(s): "AMMONIA" in the last 8760 hours. CBC: Recent Labs    05/09/23 0938  WBC 8.2  NEUTROABS 5,896  HGB 13.5  HCT 41.5  MCV 97.9  PLT 295   Lipid Panel: Recent Labs    05/09/23 0938  CHOL 182  HDL 61  LDLCALC 102*  TRIG 95  CHOLHDL 3.0   TSH: No results for input(s): "TSH" in the last 8760 hours. A1C: No results found for: "HGBA1C"   Assessment/Plan Mixed hyperlipidemia Assessment & Plan: Stable on lipitor, continue dietary modifications  Orders: -     Lipid panel -     COMPLETE METABOLIC PANEL WITHOUT GFR  Hypertension, unspecified type Assessment & Plan: Blood pressure well controlled, goal bp <140/90 Continue current medications and dietary modifications follow metabolic panel  Orders: -     COMPLETE METABOLIC PANEL WITHOUT GFR -     CBC with Differential/Platelet  Hypothyroidism due to acquired atrophy  of thyroid  Assessment & Plan: Continues on synthroid , follow up TSH  Orders: -     TSH  Osteopenia, unspecified location Assessment & Plan: Recommended to take calcium  600 mg twice daily with Vitamin D 2000 units daily and weight bearing activity 30 mins/5 days a week   Primary osteoarthritis of both knees Assessment & Plan: Ongoing, well controlled with movement and aleve. Discussed potential adverse reactions with NSAIDS and to use caution.    Overactive bladder Assessment & Plan: Well controlled on vesicare    Obesity (BMI 30-39.9) Assessment & Plan: -has had healthy weight loss, continues to work on healthy weight loss through increase in physical activity and proper nutrition.    OSA on CPAP Assessment & Plan: Continue CPAP     Return in about 6 months (around 05/16/2024) for routine follow up.:  Haedyn Breau K. Denney [facility] [phone]

## 2023-11-14 NOTE — Assessment & Plan Note (Signed)
Recommended to take calcium 600 mg twice daily with Vitamin D 2000 units daily and weight bearing activity 30 mins/5 days a week   

## 2023-11-14 NOTE — Assessment & Plan Note (Signed)
 Stable on lipitor, continue dietary modifications

## 2023-11-14 NOTE — Assessment & Plan Note (Signed)
 Ongoing, well controlled with movement and aleve. Discussed potential adverse reactions with NSAIDS and to use caution.

## 2023-11-14 NOTE — Assessment & Plan Note (Signed)
 Blood pressure well controlled, goal bp <140/90 Continue current medications and dietary modifications follow metabolic panel

## 2023-11-14 NOTE — Assessment & Plan Note (Signed)
 Continue CPAP.

## 2023-11-14 NOTE — Assessment & Plan Note (Signed)
Well controlled on vesicare.

## 2023-11-14 NOTE — Assessment & Plan Note (Signed)
 Continues on synthroid , follow up TSH

## 2023-11-14 NOTE — Assessment & Plan Note (Signed)
-  has had healthy weight loss, continues to work on healthy weight loss through increase in physical activity and proper nutrition.

## 2023-11-15 ENCOUNTER — Encounter: Payer: Self-pay | Admitting: Nurse Practitioner

## 2023-11-15 LAB — CBC WITH DIFFERENTIAL/PLATELET
Absolute Lymphocytes: 1027 {cells}/uL (ref 850–3900)
Absolute Monocytes: 566 {cells}/uL (ref 200–950)
Basophils Absolute: 52 {cells}/uL (ref 0–200)
Basophils Relative: 0.8 %
Eosinophils Absolute: 293 {cells}/uL (ref 15–500)
Eosinophils Relative: 4.5 %
HCT: 41.3 % (ref 35.0–45.0)
Hemoglobin: 13.9 g/dL (ref 11.7–15.5)
MCH: 32.1 pg (ref 27.0–33.0)
MCHC: 33.7 g/dL (ref 32.0–36.0)
MCV: 95.4 fL (ref 80.0–100.0)
MPV: 11.8 fL (ref 7.5–12.5)
Monocytes Relative: 8.7 %
Neutro Abs: 4563 {cells}/uL (ref 1500–7800)
Neutrophils Relative %: 70.2 %
Platelets: 263 10*3/uL (ref 140–400)
RBC: 4.33 10*6/uL (ref 3.80–5.10)
RDW: 12.3 % (ref 11.0–15.0)
Total Lymphocyte: 15.8 %
WBC: 6.5 10*3/uL (ref 3.8–10.8)

## 2023-11-15 LAB — COMPLETE METABOLIC PANEL WITHOUT GFR
AG Ratio: 1.4 (calc) (ref 1.0–2.5)
ALT: 19 U/L (ref 6–29)
AST: 21 U/L (ref 10–35)
Albumin: 3.9 g/dL (ref 3.6–5.1)
Alkaline phosphatase (APISO): 47 U/L (ref 37–153)
BUN: 24 mg/dL (ref 7–25)
CO2: 31 mmol/L (ref 20–32)
Calcium: 9.3 mg/dL (ref 8.6–10.4)
Chloride: 106 mmol/L (ref 98–110)
Creat: 0.78 mg/dL (ref 0.60–1.00)
Globulin: 2.7 g/dL (ref 1.9–3.7)
Glucose, Bld: 98 mg/dL (ref 65–99)
Potassium: 4.9 mmol/L (ref 3.5–5.3)
Sodium: 142 mmol/L (ref 135–146)
Total Bilirubin: 0.4 mg/dL (ref 0.2–1.2)
Total Protein: 6.6 g/dL (ref 6.1–8.1)

## 2023-11-15 LAB — LIPID PANEL
Cholesterol: 161 mg/dL (ref <200)
HDL: 50 mg/dL (ref >=50)
LDL Cholesterol (Calc): 94 mg/dL
Non-HDL Cholesterol (Calc): 111 mg/dL (ref <130)
Total CHOL/HDL Ratio: 3.2 (calc) (ref <5.0)
Triglycerides: 80 mg/dL (ref <150)

## 2023-11-15 LAB — TSH: TSH: 2.01 m[IU]/L (ref 0.40–4.50)

## 2023-12-16 ENCOUNTER — Other Ambulatory Visit: Payer: Self-pay | Admitting: Nurse Practitioner

## 2024-01-03 ENCOUNTER — Other Ambulatory Visit (HOSPITAL_BASED_OUTPATIENT_CLINIC_OR_DEPARTMENT_OTHER): Payer: Self-pay

## 2024-01-03 DIAGNOSIS — Z23 Encounter for immunization: Secondary | ICD-10-CM | POA: Diagnosis not present

## 2024-01-03 MED ORDER — COMIRNATY 30 MCG/0.3ML IM SUSY
0.3000 mL | PREFILLED_SYRINGE | Freq: Once | INTRAMUSCULAR | 0 refills | Status: AC
  Filled 2024-01-03: qty 0.3, 1d supply, fill #0

## 2024-02-08 DIAGNOSIS — J0101 Acute recurrent maxillary sinusitis: Secondary | ICD-10-CM | POA: Diagnosis not present

## 2024-02-08 DIAGNOSIS — Z6831 Body mass index (BMI) 31.0-31.9, adult: Secondary | ICD-10-CM | POA: Diagnosis not present

## 2024-02-28 DIAGNOSIS — H40023 Open angle with borderline findings, high risk, bilateral: Secondary | ICD-10-CM | POA: Diagnosis not present

## 2024-02-28 DIAGNOSIS — D2330 Other benign neoplasm of skin of unspecified part of face: Secondary | ICD-10-CM | POA: Diagnosis not present

## 2024-02-28 DIAGNOSIS — H16143 Punctate keratitis, bilateral: Secondary | ICD-10-CM | POA: Diagnosis not present

## 2024-02-28 DIAGNOSIS — Z961 Presence of intraocular lens: Secondary | ICD-10-CM | POA: Diagnosis not present

## 2024-03-14 DIAGNOSIS — L820 Inflamed seborrheic keratosis: Secondary | ICD-10-CM | POA: Diagnosis not present

## 2024-03-14 DIAGNOSIS — H16143 Punctate keratitis, bilateral: Secondary | ICD-10-CM | POA: Diagnosis not present

## 2024-03-14 DIAGNOSIS — H40023 Open angle with borderline findings, high risk, bilateral: Secondary | ICD-10-CM | POA: Diagnosis not present

## 2024-03-14 DIAGNOSIS — Z961 Presence of intraocular lens: Secondary | ICD-10-CM | POA: Diagnosis not present

## 2024-03-14 DIAGNOSIS — D23111 Other benign neoplasm of skin of right upper eyelid, including canthus: Secondary | ICD-10-CM | POA: Diagnosis not present

## 2024-04-10 DIAGNOSIS — M1711 Unilateral primary osteoarthritis, right knee: Secondary | ICD-10-CM | POA: Diagnosis not present

## 2024-04-10 DIAGNOSIS — M23300 Other meniscus derangements, unspecified lateral meniscus, right knee: Secondary | ICD-10-CM | POA: Diagnosis not present

## 2024-04-10 DIAGNOSIS — M25561 Pain in right knee: Secondary | ICD-10-CM | POA: Diagnosis not present

## 2024-04-11 ENCOUNTER — Other Ambulatory Visit: Payer: Self-pay | Admitting: Nurse Practitioner

## 2024-04-11 DIAGNOSIS — E034 Atrophy of thyroid (acquired): Secondary | ICD-10-CM

## 2024-04-29 ENCOUNTER — Other Ambulatory Visit: Payer: Self-pay | Admitting: Nurse Practitioner

## 2024-04-29 DIAGNOSIS — E782 Mixed hyperlipidemia: Secondary | ICD-10-CM

## 2024-05-03 ENCOUNTER — Other Ambulatory Visit (HOSPITAL_BASED_OUTPATIENT_CLINIC_OR_DEPARTMENT_OTHER): Payer: Self-pay

## 2024-05-03 DIAGNOSIS — Z23 Encounter for immunization: Secondary | ICD-10-CM | POA: Diagnosis not present

## 2024-05-03 MED ORDER — FLUZONE HIGH-DOSE 0.5 ML IM SUSY
0.5000 mL | PREFILLED_SYRINGE | Freq: Once | INTRAMUSCULAR | 0 refills | Status: AC
  Filled 2024-05-03: qty 0.5, 1d supply, fill #0

## 2024-05-11 ENCOUNTER — Telehealth: Payer: Self-pay

## 2024-05-11 NOTE — Telephone Encounter (Signed)
 Pt is scheduled to see BW on Monday, 05-14-24, for a CPAP f/u. I could not find pt in [facility]. I called and spoke to pt. I advised pt if she would bring her sd card or machine to her appt. Pt verbalized understanding. NFN

## 2024-05-13 NOTE — Progress Notes (Signed)
 "  @Patient  ID: Ann Hamilton, female    DOB: 11-Jul-1946, 78 y.o.   MRN: [id number]  No chief complaint on file.   Referring provider: Caro Harlene POUR, NP  HPI: 78 year old female, former light smoker quit in 2019.  History significant for OSA on CPAP, hypothyroidism, osteopenia, mixed hyperlipidemia, obesity.  Previous [facility] pulmonary encounter:  05/18/2023 Patient was contacted today for virtual video visit for CPAP compliance. She is doing well. She is 100% compliant with use > 4 hours over the last 30 days.  Average usage 8 hours 16 minutes. Pressure 11 cm H2O with average AHI 0.9. She is using full face mask. She had issues with our office getting medical supply order correctly sent [employer]  OSA Patient is 100% compliant with CPAP and reports benefit from use  Pressure 11cm h20; Residual AHI 0.1/hour No changes to plan of care, advised patient continue to wear CPAP nightly Renew CPAP supplies for 1 year with [employer]   05/14/2024- Interim hx  Discussed the use of AI scribe software for clinical note transcription with the patient, who gave verbal consent to proceed.  History of Present Illness Ann Hamilton is a 78 year old female with sleep apnea who presents for a one-year follow-up. NPSG 2017 showed moderate OSA, AHI of 15/hour with lowest desaturation around 81%.     She has been using a DreamStation 2 Phillips CPAP machine, which she received as a replacement for a recalled machine approximately four years ago. She consistently uses the CPAP, averaging eight hours and ten minutes per night over the past thirty days, with a mask fit of ninety-nine percent and an average AHI of 0.6. She cannot sleep without the CPAP and feels unwell in the morning if she does not use it.  There have been no issues with the CPAP machine itself. Despite this, she manually checks her AHI and mask fit, confirming good usage and fit. She maintains her supplies through [employer],  ensuring she never runs out.  She travels frequently, including cruises to [place] , and always ensures she has her CPAP with her, even carrying extra extension cords. She recalls an instance where she stayed in a hotel due to a power outage to ensure she could use her CPAP.       No Known Allergies  Immunization History  Administered Date(s) Administered   Fluad  Quad(high Dose 65+) 04/27/2019, 04/10/2020, 03/23/2021, 03/02/2022   Fluad  Trivalent(High Dose 65+) 03/24/2023   INFLUENZA, HIGH DOSE SEASONAL PF 03/30/2017, 04/27/2018, 05/03/2024   Influenza-Unspecified 04/11/2016   PFIZER Comirnaty (Gray Top)Covid-19 Tri-Sucrose Vaccine 12/23/2020   PFIZER(Purple Top)SARS-COV-2 Vaccination 08/17/2019, 09/11/2019, 04/17/2020   Pfizer Covid-19 Vaccine Bivalent Booster 23yrs & up 04/23/2021   Pfizer(Comirnaty )Fall Seasonal Vaccine 12 years and older 05/03/2022, 03/24/2023, 01/03/2024   Pneumococcal Conjugate-13 06/21/2014   Pneumococcal Polysaccharide-23 05/13/2010   Respiratory Syncytial Virus Vaccine ,Recomb Aduvanted(Arexvy ) 03/16/2022   Td 07/12/2009, 10/25/2022   Tdap 01/08/2009, 10/25/2022   Zoster Recombinant(Shingrix) 06/16/2019, 10/07/2019    Past Medical History:  Diagnosis Date   Hearing loss    High cholesterol    Hypersomnia    Per Records from [facility]    Nocturia    Per Records from [facility]    Snoring    Per Records from [facility]    Thyroid  disease     Tobacco History: Social History   Tobacco Use  Smoking Status Former   Current packs/day: 1.00   Average packs/day: 1 pack/day for 3.0 years (3.0  ttl pk-yrs)   Types: Cigarettes  Smokeless Tobacco Never  Tobacco Comments   Quit 50 years ago as of 2019    Counseling given: Not Answered Tobacco comments: Quit 50 years ago as of 2019    Outpatient Medications Prior to Visit  Medication Sig Dispense Refill   atorvastatin  (LIPITOR) 40 MG tablet TAKE 1 TABLET BY MOUTH EVERY  DAY 90 tablet 1   Boswellia-Glucosamine-Vit D (OSTEO BI-FLEX ONE PER DAY PO) Take 1 tablet by mouth daily.     Calcium  Carb-Cholecalciferol (CALCIUM  1000 + D PO) Take 1 tablet by mouth daily.     calcium  carbonate (OS-CAL) 1250 (500 Ca) MG chewable tablet Chew by mouth.     calcium  carbonate (TUMS - DOSED IN MG ELEMENTAL CALCIUM ) 500 MG chewable tablet Chew 1 tablet by mouth daily.     cholecalciferol (VITAMIN D) 1000 units tablet Take 1,000 Units by mouth 2 (two) times daily. D3     fluticasone  (FLONASE ) 50 MCG/ACT nasal spray Place 2 sprays into both nostrils as needed for allergies or rhinitis.     levothyroxine  (SYNTHROID ) 75 MCG tablet TAKE 1 TABLET (75 MCG TOTAL) BY MOUTH DAILY BEFORE BREAKFAST. E03.4 90 tablet 1   naproxen sodium (ANAPROX) 220 MG tablet Take 220 mg by mouth as needed.     omeprazole (PRILOSEC) 20 MG capsule Take 20 mg by mouth daily. As needed     Polyethyl Glycol-Propyl Glycol (SYSTANE FREE OP) Apply 1-2 drops to eye 2 (two) times daily.     Propylene Glycol (SYSTANE BALANCE) 0.6 % SOLN Apply to eye.     solifenacin  (VESICARE ) 5 MG tablet TAKE 1 TABLET BY MOUTH EVERY DAY 90 tablet 1   No facility-administered medications prior to visit.      Review of Systems  Review of Systems  Constitutional: Negative.   Respiratory: Negative.     Physical Exam  There were no vitals taken for this visit. Physical Exam Constitutional:      Appearance: Normal appearance. She is well-developed.  HENT:     Head: Normocephalic and atraumatic.     Mouth/Throat:     Mouth: Mucous membranes are moist.     Pharynx: Oropharynx is clear.  Eyes:     Pupils: Pupils are equal, round, and reactive to light.  Cardiovascular:     Rate and Rhythm: Normal rate and regular rhythm.     Heart sounds: Normal heart sounds. No murmur heard. Pulmonary:     Effort: Pulmonary effort is normal. No respiratory distress.     Breath sounds: Normal breath sounds. No wheezing or rhonchi.   Musculoskeletal:        General: Normal range of motion.     Cervical back: Normal range of motion and neck supple.  Skin:    General: Skin is warm and dry.     Findings: No erythema or rash.  Neurological:     General: No focal deficit present.     Mental Status: She is alert and oriented to person, place, and time. Mental status is at baseline.  Psychiatric:        Mood and Affect: Mood normal.        Behavior: Behavior normal.        Thought Content: Thought content normal.        Judgment: Judgment normal.     Lab Results:  CBC    Component Value Date/Time   WBC 6.5 11/14/2023 0920   RBC 4.33 11/14/2023 0920  HGB 13.9 11/14/2023 0920   HCT 41.3 11/14/2023 0920   PLT 263 11/14/2023 0920   MCV 95.4 11/14/2023 0920   MCH 32.1 11/14/2023 0920   MCHC 33.7 11/14/2023 0920   RDW 12.3 11/14/2023 0920   LYMPHSABS 1,443 11/05/2022 0950   EOSABS 293 11/14/2023 0920   BASOSABS 52 11/14/2023 0920    BMET    Component Value Date/Time   NA 142 11/14/2023 0920   NA 138 09/14/2016 0000   K 4.9 11/14/2023 0920   CL 106 11/14/2023 0920   CO2 31 11/14/2023 0920   GLUCOSE 98 11/14/2023 0920   BUN 24 11/14/2023 0920   BUN 11 09/14/2016 0000   CREATININE 0.78 11/14/2023 0920   CALCIUM  9.3 11/14/2023 0920   GFRNONAA 64 12/09/2020 0840   GFRAA 74 12/09/2020 0840    BNP No results found for: BNP  ProBNP No results found for: PROBNP  Imaging: No results found.   Assessment & Plan:    1. OSA on CPAP (Primary)  Assessment and Plan Assessment & Plan Obstructive sleep apnea Well-controlled with CPAP therapy. She uses the CPAP machine consistently, averaging 8 hours and 10 minutes per night over the last 30 days. The average AHI is 0.6, indicating excellent control of apnea events. Mask fit is 99% on average, and there are no reported issues with the machine or supplies. The machine is approximately four years old, and there is a potential issue with data transmission  due to the transition from 3G to 5G service, but current usage data is satisfactory. - Renewed CPAP supplies for the next year through [employer]. - Continue current CPAP therapy regimen nightly 4-6 hours    Almarie LELON Ferrari, NP 05/13/2024 "

## 2024-05-14 ENCOUNTER — Encounter: Payer: Self-pay | Admitting: Primary Care

## 2024-05-14 ENCOUNTER — Ambulatory Visit: Admitting: Primary Care

## 2024-05-14 VITALS — BP 131/83 | HR 97 | Temp 97.8°F | Ht 63.0 in | Wt 170.6 lb

## 2024-05-14 DIAGNOSIS — G4733 Obstructive sleep apnea (adult) (pediatric): Secondary | ICD-10-CM

## 2024-05-14 NOTE — Patient Instructions (Signed)
  VISIT SUMMARY: You came in today for your one-year follow-up appointment regarding your sleep apnea. You have been using your DreamStation 2 Phillips CPAP machine consistently and effectively, averaging over eight hours of use per night. You have reported no issues with the machine or supplies, and you have been diligent in maintaining your equipment and ensuring its availability during travel.  YOUR PLAN: -OBSTRUCTIVE SLEEP APNEA: Obstructive sleep apnea is a condition where your airway becomes blocked during sleep, causing breathing pauses. Your condition is well-controlled with your CPAP therapy, as evidenced by your consistent usage and excellent AHI readings. We have renewed your CPAP supplies for the next year through [employer], and you should continue your current CPAP therapy regimen. We will also ensure that your prescription for CPAP supplies is sent to [employer].  INSTRUCTIONS: Please continue using your CPAP machine as you have been. Ensure that you maintain your supplies and equipment, and contact us  if you experience any issues or changes in your condition. We will see you again in one year for your next follow-up appointment.  Follow-up 1 year with Beth NP for OSA on CPAP

## 2024-05-18 ENCOUNTER — Ambulatory Visit (INDEPENDENT_AMBULATORY_CARE_PROVIDER_SITE_OTHER): Payer: Self-pay | Admitting: Nurse Practitioner

## 2024-05-18 ENCOUNTER — Encounter: Payer: Self-pay | Admitting: Nurse Practitioner

## 2024-05-18 VITALS — BP 128/82 | Temp 97.7°F | Ht 63.0 in | Wt 171.4 lb

## 2024-05-18 DIAGNOSIS — E034 Atrophy of thyroid (acquired): Secondary | ICD-10-CM | POA: Diagnosis not present

## 2024-05-18 DIAGNOSIS — E782 Mixed hyperlipidemia: Secondary | ICD-10-CM | POA: Diagnosis not present

## 2024-05-18 DIAGNOSIS — M170 Bilateral primary osteoarthritis of knee: Secondary | ICD-10-CM

## 2024-05-18 DIAGNOSIS — G4733 Obstructive sleep apnea (adult) (pediatric): Secondary | ICD-10-CM | POA: Diagnosis not present

## 2024-05-18 DIAGNOSIS — N3281 Overactive bladder: Secondary | ICD-10-CM

## 2024-05-18 DIAGNOSIS — I10 Essential (primary) hypertension: Secondary | ICD-10-CM

## 2024-05-18 DIAGNOSIS — M8580 Other specified disorders of bone density and structure, unspecified site: Secondary | ICD-10-CM | POA: Diagnosis not present

## 2024-05-18 DIAGNOSIS — K219 Gastro-esophageal reflux disease without esophagitis: Secondary | ICD-10-CM

## 2024-05-18 NOTE — Assessment & Plan Note (Signed)
 Managed with omeprazole 20 mg as needed. She uses it occasionally and is making dietary modifications. - Continue omeprazole 20 mg as needed. - Encouraged dietary modifications.

## 2024-05-18 NOTE — Assessment & Plan Note (Signed)
 Blood pressure well controlled, Continue current medications and dietary modifications follow metabolic panel

## 2024-05-18 NOTE — Assessment & Plan Note (Signed)
Recommended to take calcium 600 mg twice daily with Vitamin D 2000 units daily and weight bearing activity 30 mins/5 days a week   

## 2024-05-18 NOTE — Assessment & Plan Note (Signed)
 Continue CPAP Recent adjustments in settings

## 2024-05-18 NOTE — Assessment & Plan Note (Signed)
 Managed with VESIcare  5 mg daily. She reports doing well. - Continue VESIcare  5 mg daily.

## 2024-05-18 NOTE — Assessment & Plan Note (Signed)
 Chronic osteoarthritis with bone-on-bone contact. Recent injection provided temporary relief. She is hesitant about knee replacement surgery but acknowledges eventual need if symptoms worsen. - Discontinued daily naproxen due to risks. - Recommended Tylenol 1000 mg three times daily for pain. - Encouraged consideration of knee replacement if pain persists.

## 2024-05-18 NOTE — Assessment & Plan Note (Signed)
 Thyroid  function well-managed on Synthroid  75 mcg daily. - Continue Synthroid  75 mcg daily.

## 2024-05-18 NOTE — Progress Notes (Signed)
 Careteam: Patient Care Team: Caro Harlene POUR, NP as PCP - General (Geriatric Medicine) Jesus Oliphant, MD as Consulting Physician (Otolaryngology)  PLACE OF SERVICE:  [facility]  Advanced Directive information    No Known Allergies  Chief Complaint  Patient presents with   Medical Management of Chronic Issues    6 month routine follow up Pt stated she's going to have to have her knee replaced.  Pt stated she can't get COVID vaccine until FEB     HPI:  Discussed the use of AI scribe software for clinical note transcription with the patient, who gave verbal consent to proceed.  History of Present Illness Ann Hamilton is a 78 year old female with osteoarthritis who presents for follow-up on knee pain and medication management.  She experiences ongoing knee pain due to osteoarthritis, described as 'bone on bone'. Previous injections from an orthopedic surgeon provided temporary relief, and the patient was told by her orthopedist that she may need a knee replacement in the future. She manages symptoms by modifying activities, such as splitting her exercise routine into two 20-minute sessions instead of one 40-minute session. Despite the pain, she remains active with church activities and travel plans.  She uses naproxen daily for pain management.   Her current medications include Synthroid  75 mcg daily for thyroid  management  VESIcare  5 mg daily for overactive bladder  Lipitor 40 mg for hyperlipidemia  She uses omeprazole 20 mg as needed for acid reflux, typically once a week or less, and takes vitamin D supplements.   She also uses a CPAP machine for sleep apnea and recently had follow up.   Recently, she had a skin tag removed from her eye and was advised to use preservative-free eye drops and gel at night for dry eyes. No issues with palpitations, chest pain, shortness of breath, or changes in bowel movements. She denies any bleeding issues and continues to  use Systane eye drops four to five times a day.    Review of Systems:  Review of Systems  Constitutional:  Negative for chills, fever and weight loss.  HENT:  Negative for tinnitus.   Respiratory:  Negative for cough, sputum production and shortness of breath.   Cardiovascular:  Negative for chest pain, palpitations and leg swelling.  Gastrointestinal:  Negative for abdominal pain, constipation, diarrhea and heartburn.  Genitourinary:  Negative for dysuria, frequency and urgency.  Musculoskeletal:  Positive for joint pain. Negative for back pain, falls and myalgias.  Skin: Negative.   Neurological:  Negative for dizziness and headaches.  Psychiatric/Behavioral:  Negative for depression and memory loss. The patient does not have insomnia.     Past Medical History:  Diagnosis Date   Hearing loss    High cholesterol    Hypersomnia    Per Records from [facility]    Nocturia    Per Records from [facility]    Snoring    Per Records from [facility]    Thyroid  disease    Past Surgical History:  Procedure Laterality Date   CATARACT EXTRACTION, BILATERAL  07/12/2014   CESAREAN SECTION     AGE 2   COLONOSCOPY     10 YEARS AGO AS OF 2018    EYE SURGERY  04/2020   Social History:   reports that she has quit smoking. Her smoking use included cigarettes. She has a 3 pack-year smoking history. She has never used smokeless tobacco. She reports current alcohol use. She  reports that she does not use drugs.  Family History  Problem Relation Age of Onset   Osteoporosis Mother    Heart disease Father    Cancer Father    Heart failure Father    Heart attack Sister    Breast cancer Neg Hx     Medications: Patient's Medications  New Prescriptions   No medications on file  Previous Medications   ATORVASTATIN  (LIPITOR) 40 MG TABLET    TAKE 1 TABLET BY MOUTH EVERY DAY   BOSWELLIA-GLUCOSAMINE-VIT D (OSTEO BI-FLEX ONE PER DAY PO)    Take 1 tablet by mouth  daily.   CALCIUM  CARB-CHOLECALCIFEROL (CALCIUM  1000 + D PO)    Take 1 tablet by mouth daily.   CALCIUM  CARBONATE (OS-CAL) 1250 (500 CA) MG CHEWABLE TABLET    Chew by mouth.   CALCIUM  CARBONATE (TUMS - DOSED IN MG ELEMENTAL CALCIUM ) 500 MG CHEWABLE TABLET    Chew 1 tablet by mouth daily.   CHOLECALCIFEROL (VITAMIN D) 1000 UNITS TABLET    Take 1,000 Units by mouth 2 (two) times daily. D3   LEVOTHYROXINE  (SYNTHROID ) 75 MCG TABLET    TAKE 1 TABLET (75 MCG TOTAL) BY MOUTH DAILY BEFORE BREAKFAST. E03.4   NAPROXEN SODIUM (ANAPROX) 220 MG TABLET    Take 220 mg by mouth as needed.   OMEPRAZOLE (PRILOSEC) 20 MG CAPSULE    Take 20 mg by mouth daily. As needed   POLYETHYL GLYCOL-PROPYL GLYCOL (SYSTANE FREE OP)    Apply 1-2 drops to eye 2 (two) times daily.   PROPYLENE GLYCOL (SYSTANE BALANCE) 0.6 % SOLN    Apply to eye.   SOLIFENACIN  (VESICARE ) 5 MG TABLET    TAKE 1 TABLET BY MOUTH EVERY DAY  Modified Medications   No medications on file  Discontinued Medications   No medications on file    Physical Exam:  Vitals:   05/18/24 0859  BP: 128/82  Temp: 97.7 F (36.5 C)  SpO2: 97%  Weight: 171 lb 6.4 oz (77.7 kg)  Height: 5' 3 (1.6 m)   Body mass index is 30.36 kg/m. Wt Readings from Last 3 Encounters:  05/18/24 171 lb 6.4 oz (77.7 kg)  05/14/24 170 lb 9.6 oz (77.4 kg)  11/14/23 175 lb 6.4 oz (79.6 kg)    Physical Exam Constitutional:      General: She is not in acute distress.    Appearance: She is well-developed. She is not diaphoretic.  HENT:     Head: Normocephalic and atraumatic.     Mouth/Throat:     Pharynx: No oropharyngeal exudate.  Eyes:     Conjunctiva/sclera: Conjunctivae normal.     Pupils: Pupils are equal, round, and reactive to light.  Cardiovascular:     Rate and Rhythm: Normal rate and regular rhythm.     Heart sounds: Normal heart sounds.  Pulmonary:     Effort: Pulmonary effort is normal.     Breath sounds: Normal breath sounds.  Abdominal:     General: Bowel  sounds are normal.     Palpations: Abdomen is soft.  Musculoskeletal:     Cervical back: Normal range of motion and neck supple.     Right lower leg: No edema.     Left lower leg: No edema.  Skin:    General: Skin is warm and dry.  Neurological:     Mental Status: She is alert.  Psychiatric:        Mood and Affect: Mood normal.     Labs  reviewed: Basic Metabolic Panel: Recent Labs    11/14/23 0920  NA 142  K 4.9  CL 106  CO2 31  GLUCOSE 98  BUN 24  CREATININE 0.78  CALCIUM  9.3  TSH 2.01   Liver Function Tests: Recent Labs    11/14/23 0920  AST 21  ALT 19  BILITOT 0.4  PROT 6.6   No results for input(s): LIPASE, AMYLASE in the last 8760 hours. No results for input(s): AMMONIA in the last 8760 hours. CBC: Recent Labs    11/14/23 0920  WBC 6.5  NEUTROABS 4,563  HGB 13.9  HCT 41.3  MCV 95.4  PLT 263   Lipid Panel: Recent Labs    11/14/23 0920  CHOL 161  HDL 50  LDLCALC 94  TRIG 80  CHOLHDL 3.2   TSH: Recent Labs    11/14/23 0920  TSH 2.01   A1C: No results found for: HGBA1C   Assessment/Plan Primary osteoarthritis of both knees Assessment & Plan: Chronic osteoarthritis with bone-on-bone contact. Recent injection provided temporary relief. She is hesitant about knee replacement surgery but acknowledges eventual need if symptoms worsen. - Discontinued daily naproxen due to risks. - Recommended Tylenol 1000 mg three times daily for pain. - Encouraged consideration of knee replacement if pain persists.  Orders: -     CBC with Differential/Platelet -     Comprehensive metabolic panel with GFR  Mixed hyperlipidemia Assessment & Plan: Managed with Lipitor 40 mg daily. Previous lipid levels were at goal. - Continue Lipitor 40 mg daily.   Orders: -     Comprehensive metabolic panel with GFR  Hypothyroidism due to acquired atrophy of thyroid  Assessment & Plan: Thyroid  function well-managed on Synthroid  75 mcg daily. - Continue  Synthroid  75 mcg daily.   Hypertension, unspecified type Assessment & Plan: Blood pressure well controlled, Continue current medications and dietary modifications follow metabolic panel  Orders: -     CBC with Differential/Platelet -     Comprehensive metabolic panel with GFR  OSA on CPAP Assessment & Plan: Continue CPAP Recent adjustments in settings   Overactive bladder Assessment & Plan: Managed with VESIcare  5 mg daily. She reports doing well. - Continue VESIcare  5 mg daily.   Osteopenia, unspecified location Assessment & Plan: Recommended to take calcium  600 mg twice daily with Vitamin D 2000 units daily and weight bearing activity 30 mins/5 days a week   Gastroesophageal reflux disease without esophagitis Assessment & Plan: Managed with omeprazole 20 mg as needed. She uses it occasionally and is making dietary modifications. - Continue omeprazole 20 mg as needed. - Encouraged dietary modifications.    Return in about 6 months (around 11/15/2024) for routine follow up, labs at time of visit.:  Lynia Landry K. Caro BODILY [facility] [phone]

## 2024-05-18 NOTE — Assessment & Plan Note (Signed)
 Managed with Lipitor 40 mg daily. Previous lipid levels were at goal. - Continue Lipitor 40 mg daily.

## 2024-05-19 LAB — CBC WITH DIFFERENTIAL/PLATELET
Absolute Lymphocytes: 1015 {cells}/uL (ref 850–3900)
Absolute Monocytes: 539 {cells}/uL (ref 200–950)
Basophils Absolute: 29 {cells}/uL (ref 0–200)
Basophils Relative: 0.5 %
Eosinophils Absolute: 157 {cells}/uL (ref 15–500)
Eosinophils Relative: 2.7 %
HCT: 42.6 % (ref 35.0–45.0)
Hemoglobin: 14 g/dL (ref 11.7–15.5)
MCH: 31.7 pg (ref 27.0–33.0)
MCHC: 32.9 g/dL (ref 32.0–36.0)
MCV: 96.4 fL (ref 80.0–100.0)
MPV: 10.8 fL (ref 7.5–12.5)
Monocytes Relative: 9.3 %
Neutro Abs: 4060 {cells}/uL (ref 1500–7800)
Neutrophils Relative %: 70 %
Platelets: 284 Thousand/uL (ref 140–400)
RBC: 4.42 Million/uL (ref 3.80–5.10)
RDW: 12.3 % (ref 11.0–15.0)
Total Lymphocyte: 17.5 %
WBC: 5.8 Thousand/uL (ref 3.8–10.8)

## 2024-05-19 LAB — COMPREHENSIVE METABOLIC PANEL WITH GFR
AG Ratio: 1.4 (calc) (ref 1.0–2.5)
ALT: 21 U/L (ref 6–29)
AST: 22 U/L (ref 10–35)
Albumin: 3.8 g/dL (ref 3.6–5.1)
Alkaline phosphatase (APISO): 47 U/L (ref 37–153)
BUN: 19 mg/dL (ref 7–25)
CO2: 31 mmol/L (ref 20–32)
Calcium: 9 mg/dL (ref 8.6–10.4)
Chloride: 106 mmol/L (ref 98–110)
Creat: 0.9 mg/dL (ref 0.60–1.00)
Globulin: 2.7 g/dL (ref 1.9–3.7)
Glucose, Bld: 93 mg/dL (ref 65–99)
Potassium: 4.6 mmol/L (ref 3.5–5.3)
Sodium: 141 mmol/L (ref 135–146)
Total Bilirubin: 0.5 mg/dL (ref 0.2–1.2)
Total Protein: 6.5 g/dL (ref 6.1–8.1)
eGFR: 65 mL/min/1.73m2 (ref >=60)

## 2024-05-20 ENCOUNTER — Ambulatory Visit: Payer: Self-pay | Admitting: Nurse Practitioner

## 2024-06-06 ENCOUNTER — Other Ambulatory Visit: Payer: Self-pay | Admitting: Nurse Practitioner

## 2024-06-28 ENCOUNTER — Encounter: Payer: Medicare Other | Admitting: Nurse Practitioner

## 2024-11-19 ENCOUNTER — Ambulatory Visit: Admitting: Nurse Practitioner
